# Patient Record
Sex: Female | Born: 1955 | Race: Black or African American | Hispanic: No | Marital: Single | Smoking: Never smoker
Health system: Southern US, Community
[De-identification: ages and names within clinical notes are randomized; demographics above are authoritative.]

## PROBLEM LIST (undated history)

## (undated) DIAGNOSIS — C801 Malignant (primary) neoplasm, unspecified: Secondary | ICD-10-CM

## (undated) DIAGNOSIS — D573 Sickle-cell trait: Secondary | ICD-10-CM

## (undated) DIAGNOSIS — E119 Type 2 diabetes mellitus without complications: Secondary | ICD-10-CM

## (undated) DIAGNOSIS — D689 Coagulation defect, unspecified: Secondary | ICD-10-CM

## (undated) DIAGNOSIS — N649 Disorder of breast, unspecified: Secondary | ICD-10-CM

## (undated) DIAGNOSIS — T7840XA Allergy, unspecified, initial encounter: Secondary | ICD-10-CM

## (undated) DIAGNOSIS — H409 Unspecified glaucoma: Secondary | ICD-10-CM

## (undated) DIAGNOSIS — M1990 Unspecified osteoarthritis, unspecified site: Secondary | ICD-10-CM

## (undated) DIAGNOSIS — I10 Essential (primary) hypertension: Secondary | ICD-10-CM

## (undated) DIAGNOSIS — E785 Hyperlipidemia, unspecified: Secondary | ICD-10-CM

## (undated) HISTORY — DX: Disorder of breast, unspecified: N64.9

## (undated) HISTORY — PX: ROTATOR CUFF REPAIR: SHX139

## (undated) HISTORY — DX: Sickle-cell trait: D57.3

## (undated) HISTORY — PX: FRACTURE SURGERY: SHX138

## (undated) HISTORY — DX: Essential (primary) hypertension: I10

## (undated) HISTORY — PX: TUBAL LIGATION: SHX77

## (undated) HISTORY — PX: POLYPECTOMY: SHX149

## (undated) HISTORY — DX: Hyperlipidemia, unspecified: E78.5

## (undated) HISTORY — DX: Unspecified osteoarthritis, unspecified site: M19.90

## (undated) HISTORY — PX: COSMETIC SURGERY: SHX468

## (undated) HISTORY — DX: Coagulation defect, unspecified: D68.9

## (undated) HISTORY — PX: COLONOSCOPY: SHX174

## (undated) HISTORY — DX: Unspecified glaucoma: H40.9

## (undated) HISTORY — DX: Type 2 diabetes mellitus without complications: E11.9

## (undated) HISTORY — DX: Malignant (primary) neoplasm, unspecified: C80.1

## (undated) HISTORY — DX: Allergy, unspecified, initial encounter: T78.40XA

---

## 2007-02-01 ENCOUNTER — Ambulatory Visit: Payer: Self-pay | Admitting: Nurse Practitioner

## 2007-02-01 DIAGNOSIS — Z853 Personal history of malignant neoplasm of breast: Secondary | ICD-10-CM | POA: Insufficient documentation

## 2007-02-01 DIAGNOSIS — J309 Allergic rhinitis, unspecified: Secondary | ICD-10-CM | POA: Insufficient documentation

## 2007-02-01 DIAGNOSIS — B351 Tinea unguium: Secondary | ICD-10-CM

## 2007-02-01 LAB — CONVERTED CEMR LAB
Basophils Absolute: 0 10*3/uL (ref 0.0–0.1)
CO2: 24 meq/L (ref 19–32)
Cholesterol: 211 mg/dL — ABNORMAL HIGH (ref 0–200)
Creatinine, Ser: 0.81 mg/dL (ref 0.40–1.20)
Eosinophils Relative: 4 % (ref 0–5)
Glucose, Bld: 105 mg/dL — ABNORMAL HIGH (ref 70–99)
HCT: 42.1 % (ref 36.0–46.0)
Hemoglobin: 13.5 g/dL (ref 12.0–15.0)
Lymphocytes Relative: 36 % (ref 12–46)
Lymphs Abs: 2.2 10*3/uL (ref 0.7–4.0)
MCV: 83.4 fL (ref 78.0–100.0)
Monocytes Absolute: 0.5 10*3/uL (ref 0.1–1.0)
RDW: 14.7 % (ref 11.5–15.5)
Total Bilirubin: 0.4 mg/dL (ref 0.3–1.2)
Total CHOL/HDL Ratio: 3.7
Triglycerides: 88 mg/dL (ref <150)
VLDL: 18 mg/dL (ref 0–40)

## 2007-02-02 ENCOUNTER — Encounter (INDEPENDENT_AMBULATORY_CARE_PROVIDER_SITE_OTHER): Payer: Self-pay | Admitting: Nurse Practitioner

## 2007-03-06 ENCOUNTER — Ambulatory Visit: Payer: Self-pay | Admitting: Nurse Practitioner

## 2007-03-06 DIAGNOSIS — A599 Trichomoniasis, unspecified: Secondary | ICD-10-CM | POA: Insufficient documentation

## 2007-03-06 DIAGNOSIS — E7800 Pure hypercholesterolemia, unspecified: Secondary | ICD-10-CM | POA: Insufficient documentation

## 2007-03-06 LAB — CONVERTED CEMR LAB
Bilirubin Urine: NEGATIVE
Blood in Urine, dipstick: NEGATIVE
Glucose, Urine, Semiquant: NEGATIVE
Protein, U semiquant: NEGATIVE
pH: 5.5

## 2007-03-07 ENCOUNTER — Encounter (INDEPENDENT_AMBULATORY_CARE_PROVIDER_SITE_OTHER): Payer: Self-pay | Admitting: Nurse Practitioner

## 2007-03-07 LAB — CONVERTED CEMR LAB: Chlamydia, Swab/Urine, PCR: NEGATIVE

## 2007-03-08 ENCOUNTER — Ambulatory Visit: Payer: Self-pay | Admitting: Nurse Practitioner

## 2007-03-09 ENCOUNTER — Encounter (INDEPENDENT_AMBULATORY_CARE_PROVIDER_SITE_OTHER): Payer: Self-pay | Admitting: Nurse Practitioner

## 2007-03-13 ENCOUNTER — Ambulatory Visit: Payer: Self-pay | Admitting: Internal Medicine

## 2007-03-16 ENCOUNTER — Ambulatory Visit (HOSPITAL_COMMUNITY): Admission: RE | Admit: 2007-03-16 | Discharge: 2007-03-16 | Payer: Self-pay | Admitting: Family Medicine

## 2007-03-20 ENCOUNTER — Ambulatory Visit: Payer: Self-pay | Admitting: Internal Medicine

## 2007-03-20 ENCOUNTER — Encounter: Payer: Self-pay | Admitting: Internal Medicine

## 2007-04-19 ENCOUNTER — Encounter (INDEPENDENT_AMBULATORY_CARE_PROVIDER_SITE_OTHER): Payer: Self-pay | Admitting: Nurse Practitioner

## 2007-08-14 ENCOUNTER — Ambulatory Visit: Payer: Self-pay | Admitting: Nurse Practitioner

## 2007-08-17 ENCOUNTER — Ambulatory Visit: Payer: Self-pay | Admitting: Family Medicine

## 2007-10-29 ENCOUNTER — Encounter (INDEPENDENT_AMBULATORY_CARE_PROVIDER_SITE_OTHER): Payer: Self-pay | Admitting: Nurse Practitioner

## 2008-09-29 ENCOUNTER — Telehealth: Payer: Self-pay | Admitting: Internal Medicine

## 2008-10-20 ENCOUNTER — Telehealth (INDEPENDENT_AMBULATORY_CARE_PROVIDER_SITE_OTHER): Payer: Self-pay | Admitting: Nurse Practitioner

## 2008-10-29 ENCOUNTER — Ambulatory Visit (HOSPITAL_COMMUNITY): Admission: RE | Admit: 2008-10-29 | Discharge: 2008-10-29 | Payer: Self-pay | Admitting: Internal Medicine

## 2008-10-29 DIAGNOSIS — S82899A Other fracture of unspecified lower leg, initial encounter for closed fracture: Secondary | ICD-10-CM | POA: Insufficient documentation

## 2008-11-07 ENCOUNTER — Ambulatory Visit: Payer: Self-pay | Admitting: Internal Medicine

## 2008-11-13 ENCOUNTER — Ambulatory Visit: Payer: Self-pay | Admitting: Internal Medicine

## 2008-11-14 ENCOUNTER — Encounter (INDEPENDENT_AMBULATORY_CARE_PROVIDER_SITE_OTHER): Payer: Self-pay | Admitting: Nurse Practitioner

## 2008-11-17 ENCOUNTER — Observation Stay (HOSPITAL_COMMUNITY): Admission: EM | Admit: 2008-11-17 | Discharge: 2008-11-18 | Payer: Self-pay | Admitting: Emergency Medicine

## 2008-12-09 ENCOUNTER — Observation Stay (HOSPITAL_COMMUNITY): Admission: RE | Admit: 2008-12-09 | Discharge: 2008-12-10 | Payer: Self-pay | Admitting: Professional

## 2008-12-09 ENCOUNTER — Encounter (INDEPENDENT_AMBULATORY_CARE_PROVIDER_SITE_OTHER): Payer: Self-pay | Admitting: Orthopedic Surgery

## 2008-12-09 ENCOUNTER — Ambulatory Visit: Payer: Self-pay | Admitting: Vascular Surgery

## 2008-12-12 ENCOUNTER — Encounter (INDEPENDENT_AMBULATORY_CARE_PROVIDER_SITE_OTHER): Payer: Self-pay | Admitting: Nurse Practitioner

## 2008-12-12 ENCOUNTER — Encounter (INDEPENDENT_AMBULATORY_CARE_PROVIDER_SITE_OTHER): Payer: Self-pay | Admitting: Internal Medicine

## 2008-12-15 ENCOUNTER — Encounter: Payer: Self-pay | Admitting: Nurse Practitioner

## 2008-12-15 ENCOUNTER — Telehealth (INDEPENDENT_AMBULATORY_CARE_PROVIDER_SITE_OTHER): Payer: Self-pay | Admitting: Nurse Practitioner

## 2008-12-15 LAB — CONVERTED CEMR LAB: INR: 1.2

## 2008-12-19 ENCOUNTER — Encounter (INDEPENDENT_AMBULATORY_CARE_PROVIDER_SITE_OTHER): Payer: Self-pay | Admitting: Nurse Practitioner

## 2008-12-23 ENCOUNTER — Ambulatory Visit: Payer: Self-pay | Admitting: Nurse Practitioner

## 2008-12-23 DIAGNOSIS — I82409 Acute embolism and thrombosis of unspecified deep veins of unspecified lower extremity: Secondary | ICD-10-CM | POA: Insufficient documentation

## 2008-12-23 LAB — CONVERTED CEMR LAB: INR: 2.3

## 2008-12-24 ENCOUNTER — Telehealth (INDEPENDENT_AMBULATORY_CARE_PROVIDER_SITE_OTHER): Payer: Self-pay | Admitting: Nurse Practitioner

## 2008-12-25 ENCOUNTER — Encounter (INDEPENDENT_AMBULATORY_CARE_PROVIDER_SITE_OTHER): Payer: Self-pay | Admitting: Nurse Practitioner

## 2008-12-29 ENCOUNTER — Encounter: Admission: RE | Admit: 2008-12-29 | Discharge: 2009-02-27 | Payer: Self-pay | Admitting: Orthopedic Surgery

## 2008-12-31 ENCOUNTER — Encounter (INDEPENDENT_AMBULATORY_CARE_PROVIDER_SITE_OTHER): Payer: Self-pay | Admitting: Nurse Practitioner

## 2009-01-13 ENCOUNTER — Telehealth (INDEPENDENT_AMBULATORY_CARE_PROVIDER_SITE_OTHER): Payer: Self-pay | Admitting: Nurse Practitioner

## 2009-01-13 ENCOUNTER — Encounter: Payer: Self-pay | Admitting: Nurse Practitioner

## 2009-01-21 ENCOUNTER — Ambulatory Visit: Payer: Self-pay | Admitting: Nurse Practitioner

## 2009-01-25 ENCOUNTER — Encounter: Payer: Self-pay | Admitting: Nurse Practitioner

## 2009-02-16 ENCOUNTER — Ambulatory Visit: Payer: Self-pay | Admitting: Nurse Practitioner

## 2009-02-19 LAB — CONVERTED CEMR LAB: Prothrombin Time: 23.9 s — ABNORMAL HIGH (ref 11.6–15.2)

## 2009-03-01 ENCOUNTER — Encounter: Admission: RE | Admit: 2009-03-01 | Discharge: 2009-03-05 | Payer: Self-pay | Admitting: Orthopedic Surgery

## 2009-03-03 ENCOUNTER — Telehealth (INDEPENDENT_AMBULATORY_CARE_PROVIDER_SITE_OTHER): Payer: Self-pay | Admitting: Nurse Practitioner

## 2009-03-19 ENCOUNTER — Ambulatory Visit: Payer: Self-pay | Admitting: Nurse Practitioner

## 2009-03-20 ENCOUNTER — Encounter (INDEPENDENT_AMBULATORY_CARE_PROVIDER_SITE_OTHER): Payer: Self-pay | Admitting: Nurse Practitioner

## 2009-03-20 LAB — CONVERTED CEMR LAB
INR: 2.48 — ABNORMAL HIGH (ref <1.50)
Prothrombin Time: 26.6 s — ABNORMAL HIGH (ref 11.6–15.2)

## 2009-04-02 ENCOUNTER — Telehealth (INDEPENDENT_AMBULATORY_CARE_PROVIDER_SITE_OTHER): Payer: Self-pay | Admitting: Nurse Practitioner

## 2009-04-13 ENCOUNTER — Encounter (INDEPENDENT_AMBULATORY_CARE_PROVIDER_SITE_OTHER): Payer: Self-pay | Admitting: Nurse Practitioner

## 2009-04-20 ENCOUNTER — Ambulatory Visit: Payer: Self-pay | Admitting: Nurse Practitioner

## 2009-04-21 LAB — CONVERTED CEMR LAB
INR: 1.61 — ABNORMAL HIGH (ref <1.50)
Prothrombin Time: 19 s — ABNORMAL HIGH (ref 11.6–15.2)

## 2009-05-07 ENCOUNTER — Telehealth (INDEPENDENT_AMBULATORY_CARE_PROVIDER_SITE_OTHER): Payer: Self-pay | Admitting: Nurse Practitioner

## 2009-05-11 ENCOUNTER — Ambulatory Visit: Payer: Self-pay | Admitting: Nurse Practitioner

## 2009-05-15 ENCOUNTER — Encounter (INDEPENDENT_AMBULATORY_CARE_PROVIDER_SITE_OTHER): Payer: Self-pay | Admitting: Nurse Practitioner

## 2009-06-09 ENCOUNTER — Ambulatory Visit: Payer: Self-pay | Admitting: Nurse Practitioner

## 2009-06-09 ENCOUNTER — Telehealth (INDEPENDENT_AMBULATORY_CARE_PROVIDER_SITE_OTHER): Payer: Self-pay | Admitting: Nurse Practitioner

## 2009-06-10 LAB — CONVERTED CEMR LAB: INR: 1.6 — ABNORMAL HIGH (ref <1.50)

## 2009-06-15 ENCOUNTER — Telehealth (INDEPENDENT_AMBULATORY_CARE_PROVIDER_SITE_OTHER): Payer: Self-pay | Admitting: Nurse Practitioner

## 2009-06-24 ENCOUNTER — Telehealth (INDEPENDENT_AMBULATORY_CARE_PROVIDER_SITE_OTHER): Payer: Self-pay | Admitting: Nurse Practitioner

## 2009-06-24 ENCOUNTER — Ambulatory Visit: Payer: Self-pay | Admitting: Vascular Surgery

## 2009-06-24 ENCOUNTER — Encounter (INDEPENDENT_AMBULATORY_CARE_PROVIDER_SITE_OTHER): Payer: Self-pay | Admitting: Internal Medicine

## 2009-06-24 ENCOUNTER — Ambulatory Visit: Admission: RE | Admit: 2009-06-24 | Discharge: 2009-06-24 | Payer: Self-pay | Admitting: Internal Medicine

## 2009-08-04 ENCOUNTER — Ambulatory Visit: Payer: Self-pay | Admitting: Nurse Practitioner

## 2009-08-04 DIAGNOSIS — E669 Obesity, unspecified: Secondary | ICD-10-CM | POA: Insufficient documentation

## 2009-08-04 LAB — CONVERTED CEMR LAB
ALT: 9 units/L (ref 0–35)
AST: 16 units/L (ref 0–37)
BUN: 13 mg/dL (ref 6–23)
Basophils Absolute: 0 10*3/uL (ref 0.0–0.1)
Basophils Relative: 1 % (ref 0–1)
Blood in Urine, dipstick: NEGATIVE
Creatinine, Ser: 0.77 mg/dL (ref 0.40–1.20)
Eosinophils Absolute: 0.2 10*3/uL (ref 0.0–0.7)
HDL: 56 mg/dL (ref >39)
KOH Prep: NEGATIVE
Ketones, urine, test strip: NEGATIVE
MCHC: 32 g/dL (ref 30.0–36.0)
MCV: 84.7 fL (ref 78.0–100.0)
Neutrophils Relative %: 51 % (ref 43–77)
Nitrite: NEGATIVE
OCCULT 1: NEGATIVE
Platelets: 322 10*3/uL (ref 150–400)
RDW: 15.4 % (ref 11.5–15.5)
Rapid HIV Screen: NEGATIVE
TSH: 1.817 microintl units/mL (ref 0.350–4.500)
Total Bilirubin: 0.3 mg/dL (ref 0.3–1.2)
Total CHOL/HDL Ratio: 4
VLDL: 16 mg/dL (ref 0–40)
WBC: 5.7 10*3/uL (ref 4.0–10.5)

## 2009-08-05 ENCOUNTER — Encounter (INDEPENDENT_AMBULATORY_CARE_PROVIDER_SITE_OTHER): Payer: Self-pay | Admitting: Nurse Practitioner

## 2009-08-05 ENCOUNTER — Telehealth (INDEPENDENT_AMBULATORY_CARE_PROVIDER_SITE_OTHER): Payer: Self-pay | Admitting: Nurse Practitioner

## 2009-08-07 ENCOUNTER — Ambulatory Visit: Payer: Self-pay | Admitting: Nurse Practitioner

## 2009-08-07 LAB — CONVERTED CEMR LAB: Pap Smear: NEGATIVE

## 2009-08-10 ENCOUNTER — Ambulatory Visit: Payer: Self-pay | Admitting: Nurse Practitioner

## 2009-08-21 ENCOUNTER — Telehealth (INDEPENDENT_AMBULATORY_CARE_PROVIDER_SITE_OTHER): Payer: Self-pay | Admitting: Nurse Practitioner

## 2009-09-23 ENCOUNTER — Ambulatory Visit: Payer: Self-pay | Admitting: Nurse Practitioner

## 2009-09-23 DIAGNOSIS — M79609 Pain in unspecified limb: Secondary | ICD-10-CM

## 2009-11-20 ENCOUNTER — Encounter (INDEPENDENT_AMBULATORY_CARE_PROVIDER_SITE_OTHER): Payer: Self-pay | Admitting: Nurse Practitioner

## 2009-11-20 ENCOUNTER — Telehealth (INDEPENDENT_AMBULATORY_CARE_PROVIDER_SITE_OTHER): Payer: Self-pay | Admitting: Nurse Practitioner

## 2009-11-20 DIAGNOSIS — Z780 Asymptomatic menopausal state: Secondary | ICD-10-CM | POA: Insufficient documentation

## 2009-12-07 ENCOUNTER — Encounter (INDEPENDENT_AMBULATORY_CARE_PROVIDER_SITE_OTHER): Payer: Self-pay | Admitting: Nurse Practitioner

## 2009-12-07 ENCOUNTER — Ambulatory Visit (HOSPITAL_COMMUNITY): Admission: RE | Admit: 2009-12-07 | Discharge: 2009-12-07 | Payer: Self-pay | Admitting: Internal Medicine

## 2009-12-15 ENCOUNTER — Encounter (INDEPENDENT_AMBULATORY_CARE_PROVIDER_SITE_OTHER): Payer: Self-pay | Admitting: Nurse Practitioner

## 2010-03-20 ENCOUNTER — Encounter: Payer: Self-pay | Admitting: Internal Medicine

## 2010-03-25 ENCOUNTER — Ambulatory Visit
Admission: RE | Admit: 2010-03-25 | Discharge: 2010-03-25 | Payer: Self-pay | Source: Home / Self Care | Attending: Nurse Practitioner | Admitting: Nurse Practitioner

## 2010-03-25 DIAGNOSIS — R030 Elevated blood-pressure reading, without diagnosis of hypertension: Secondary | ICD-10-CM | POA: Insufficient documentation

## 2010-03-25 DIAGNOSIS — R110 Nausea: Secondary | ICD-10-CM | POA: Insufficient documentation

## 2010-03-25 LAB — CONVERTED CEMR LAB
Glucose, Bld: 99 mg/dL
LDL Goal: 160 mg/dL

## 2010-03-30 NOTE — Letter (Signed)
Summary: REFERRAL//PODIATRY//APPT DATE & TIME  REFERRAL//PODIATRY//APPT DATE & TIME   Imported By: Arta Bruce 09/24/2009 14:43:43  _____________________________________________________________________  External Attachment:    Type:   Image     Comment:   External Document

## 2010-03-30 NOTE — Letter (Signed)
Summary: *HSN Results Follow up  [facility]  [address] [place], [place] [address]   Phone: [phone]  Fax: [phone]      12/15/2009   [facility] [address] [place], [place]  [address]   Dear  Ms. Joscelyne Mackiewicz,                            ____S.Drinkard,FNP   ____D. Gore,FNP       ____B. McPherson,MD   ____V. Rankins,MD    ____E. Mulberry,MD    _X___N. [name], FNP  ____D. Reche Dixon, MD    ____K. Philipp Deputy, MD    ____Other     This letter is to inform you that your recent test(s):  Bone Density     ___X____ is within acceptable limits  _______ requires a medication change  _______ requires a follow-up lab visit  _______ requires a follow-up visit with your Braxton Vantrease   Comments: Bone density results are normal.       _________________________________________________________ If you have any questions, please contact our office [phone].                    Sincerely,    [name] [facility]

## 2010-03-30 NOTE — Letter (Signed)
Summary: periodontal scalings  periodontal scalings   Imported By: Arta Bruce 04/13/2009 15:53:32  _____________________________________________________________________  External Attachment:    Type:   Image     Comment:   External Document

## 2010-03-30 NOTE — Letter (Signed)
Summary: Bethany MENTOR/FORM  Cushing MENTOR/FORM   Imported By: Arta Bruce 08/11/2009 09:26:18  _____________________________________________________________________  External Attachment:    Type:   Image     Comment:   External Document

## 2010-03-30 NOTE — Letter (Signed)
Summary: *HSN Results Follow up  [facility]  [address] [place], [place] [address]   Phone: [phone]  Fax: [phone]      03/20/2009   [facility] [address] [place], [place]  [address]   Dear  [name],                            ____S.Drinkard,FNP   ____D. Gore,FNP       ____B. McPherson,MD   ____V. Rankins,MD    ____E. Mulberry,MD    _X___N. [name], FNP  ____D. Reche Dixon, MD    ____K. Philipp Deputy, MD    ____Other     This letter is to inform you that your recent test(s):  _______Pap Smear    __X_____Lab Test     _______X-ray    ___X____ is within acceptable limits  _______ requires a medication change  _______ requires a follow-up lab visit  _______ requires a follow-up visit with your provider   Comments:  Your INR is 2.4.  Continue coumadin at current dose. Follow up in 4 weeks for PT/INR.  [facility] Your therapy should continue until April 26th, 2011.      _________________________________________________________ If you have any questions, please contact our office [phone].                    Sincerely,    [name] [facility]

## 2010-03-30 NOTE — Letter (Signed)
Summary: TEST ORDER FORM//MAMMOGRAM//APPT DATE & TIME  TEST ORDER FORM//MAMMOGRAM//APPT DATE & TIME   Imported By: Arta Bruce 12/03/2009 14:27:50  _____________________________________________________________________  External Attachment:    Type:   Image     Comment:   External Document

## 2010-03-30 NOTE — Letter (Signed)
Summary: Discharge Summary  Discharge Summary   Imported By: Arta Bruce 04/14/2009 11:32:55  _____________________________________________________________________  External Attachment:    Type:   Image     Comment:   External Document

## 2010-03-30 NOTE — Assessment & Plan Note (Signed)
Summary: Left foot pain   Vital Signs:  Patient profile:   55 year old female Menstrual status:  postmenopausal Weight:      220.2 pounds BMI:     36.78 Temp:     97.5 degrees F oral Pulse rate:   75 / minute Pulse rhythm:   regular Resp:     20 per minute BP sitting:   131 / 89  (left arm) Cuff size:   regular  Vitals Entered By: [name] (September 23, 2009 11:02 AM) CC: pt has a heel spur in left heel she is now having a lot of pain she is not sure if it come from her working out or gaining weight it started when she had a massage in May Is Patient Diabetic? No Pain Assessment Patient in pain? yes     Location: heel Onset of pain  With activity  Does patient need assistance? Functional Status Self care Ambulation Normal   CC:  pt has a heel spur in left heel she is now having a lot of pain she is not sure if it come from her working out or gaining weight it started when she had a massage in May.  History of Present Illness:  Pt into the office with pain in the left foot ?heel spur stabbing pain in the left foot intermittent when driving, standing. Pain last for 1 minute then subsides Causes may be +overweight +pt has increased her exercise with weight and zumba Pt remembered when she was having a reflex massage that when he touched the area of concern she noticed at that time it was tender  Right toenail - came off spontaneously about 1 month ago. She had taken meds in the past which did not help. Pt has questions about what she can do for the toenail.  Diabetic Foot Exam Foot Inspection Are the toenails thick?                Yes Are the toenails ingrown?              No  Diabetic Foot Care Education   Allergies (verified): No Known Drug Allergies  Review of Systems CV:  Denies chest pain or discomfort. Resp:  Denies cough. GI:  Denies abdominal pain, nausea, and vomiting. MS:  left foot pain.  Physical Exam  General:  alert.   Head:   normocephalic.   Extremities:  1+ right pedal edema.   Neurologic:  alert & oriented X3.     Foot/Ankle Exam  Foot Exam:    Left:    Inspection:  Normal    Palpation:  Abnormal       Location:  hindfoot    Stability:  stable    Tenderness:  no    Swelling:  no    Erythema:  no   Impression & Recommendations:  Problem # 1:  FOOT PAIN, LEFT (ICD-729.5) handout given for heel spur advised pt to wear insert in her shoes  Problem # 2:  OBESITY (ICD-278.00) pt has been trying to lose weight so advised her to wear good supportive shoes  Problem # 3:  DERMATOPHYTOSIS, NAIL (ICD-110.1) will refer to podiatry for possible removal of great toe nail she has tried and failed topical and oral agents Orders: Podiatry Referral (Podiatry)  Complete Medication List: 1)  Caltrate 600+d 600-400 Mg-unit Tabs (Calcium carbonate-vitamin d) .... One tablet by mouth two times a day  Patient Instructions: 1)  Read the handout on heel spurs. 2)  Remember to wear good inserts in your shoes 3)  You will be referred to the podiatrist.

## 2010-03-30 NOTE — Progress Notes (Signed)
Summary: Office Visit/depression screening  Office Visit/depression screening   Imported By: Arta Bruce 09/07/2009 16:04:20  _____________________________________________________________________  External Attachment:    Type:   Image     Comment:   External Document

## 2010-03-30 NOTE — Letter (Signed)
Summary: Lipid Letter  [facility]  [address] [place], [place] [address]   Phone: [phone]  Fax: [phone]    08/05/2009  Suzanne Baldwin [address] [place], [place]  [address]  Dear [name]:  We have carefully reviewed your last lipid profile from 08/04/2009 and the results are noted below with a summary of recommendations for lipid management.    Cholesterol:       223     Goal: less than 200   HDL "good" Cholesterol:   56     Goal: greater than 40   LDL "bad" Cholesterol:   151     Goal: less than 130   Triglycerides:       82     Goal: less than 150    Your cholesterol is still high.  I would still like for you to manage with diet and exercise.  Be sure to avoid fried fatty foods.  Make low fat food choices with milk, cheese, and mayo,  Pap Smear results ___________________________.     Current Medications: 1)    Caltrate 600+d 600-400 Mg-unit Tabs (Calcium carbonate-vitamin d) .... One tablet by mouth two times a day  If you have any questions, please call. We appreciate being able to work with you.   Sincerely,    [facility] [name]

## 2010-03-30 NOTE — Progress Notes (Signed)
Summary: Vascular results   Phone Note From Other Clinic   Summary of Call: Called report from Vascular lab.  Negative for DVT.  Pt anxious to stop coumadin she can be reached at [phone]. Initial call taken by: Vesta Mixer CMA,  June 24, 2009 3:05 PM  Follow-up for Phone Call        when did pt have it done, today? no report yet in EMR. If you personally spoke with radiology and they said test was negative then ok to have pt stop the coumadin. otherwise will need to see the results Follow-up by: [name],  June 24, 2009 3:42 PM  Additional Follow-up for Phone Call Additional follow up Details #1::        Yes, it was done today and did speak with Vascular lab personally.  Will call pt to inform her. Additional Follow-up by: Vesta Mixer CMA,  June 24, 2009 3:46 PM    Additional Follow-up for Phone Call Additional follow up Details #2::    Pt. is aware that she may stop coumadin  ....Marland KitchenMarland KitchenMarland Kitchen Chauncy Passy SMA     June 24, 2009 3:53 PM

## 2010-03-30 NOTE — Letter (Signed)
Summary: Handout Printed  Printed Handout:  - Heel Spur 

## 2010-03-30 NOTE — Assessment & Plan Note (Signed)
Summary: PTINR////KT   Nurse Visit  pt is takes two 5mg  tabs at 6pm everyday....   Allergies: No Known Drug Allergies  Orders Added: 1)  Est. Patient Level I [44010] 2)  T-Protime, Auto [[id number]]

## 2010-03-30 NOTE — Assessment & Plan Note (Signed)
Summary: Complete Physical Exam   Vital Signs:  Patient profile:   55 year old female Menstrual status:  postmenopausal Weight:      217.3 pounds BMI:     36.29 BSA:     2.05 Temp:     98.0 degrees F oral Pulse rate:   77 / minute Pulse rhythm:   regular Resp:     20 per minute BP sitting:   149 / 91  (left arm) Cuff size:   regular  Vitals Entered By: [name] (August 04, 2009 9:14 AM) CC: CPP...still having swelling in her ankle Is Patient Diabetic? No Pain Assessment Patient in pain? no       Does patient need assistance? Functional Status Self care Ambulation Normal  Vision Screening:      Vision Comments: 02/2010   CC:  CPP...still having swelling in her ankle.  History of Present Illness:  Pt into the office for CPE.  PAP - last done in 2009 Maternal aunt with cervical cancer postmenopausal - last menses 5 years ago.  Mammogram - last done 10/29/2008 pt is a breast cancer survivor at age 32.  Mother then was dx after pt - she had double mastectomy.  Optho - Last eye exam in 2011.  New glasses for reading  Dental - last exam was March 2011 for preventative care.  S/p right ankle fracture and DVT - over 6 months ago.  She is no longer on coumadin. No pain. Notes that she does still have some swelling with she is up for extended periods of time.  Colonscopy - up to date done in 2009.  pt will need repeated in 7 years  Obesity - lost 9 pounds since her last exam. pt has been trying to lose weight.    Diabetes Management History:      She says that she is exercising.  Type of exercise includes: walking, aerobics.  She is doing this 3 times per week.    Habits & Providers  Alcohol-Tobacco-Diet     Alcohol drinks/day: 0     Tobacco Status: never  Exercise-Depression-Behavior     Does Patient Exercise: yes     Exercise Counseling: not indicated; exercise is adequate     Type of exercise: walking, aerobics,zumba     Times/week: 3     Have  you felt down or hopeless? no     Have you felt little pleasure in things? no     Depression Counseling: not indicated; screening negative for depression     Drug Use: no     Seat Belt Use: 100  Comments: PHQ-9 score = 4  Allergies (verified): No Known Drug Allergies  Social History: Smoking Status:  never  Review of Systems General:  Denies fever. Eyes:  Denies blurring. ENT:  Denies earache. CV:  Denies chest pain or discomfort. Resp:  Denies cough. GI:  Denies abdominal pain and constipation. GU:  Denies dysuria. MS:  right ankle sweeling. Derm:  Denies rash. Neuro:  Denies headaches. Psych:  Denies anxiety and depression.  Physical Exam  General:  alert.   Head:  normocephalic.   Eyes:  pupils equal, pupils round, and pupils reactive to light.   Ears:  ear piercing(s) noted.   TM visible with bony landmarks present Nose:  no nasal discharge.   Mouth:  pharynx pink and moist.   Neck:  supple.   Chest Wall:  no mass.   Breasts:  skin/areolae normal, no masses, and no abnormal  thickening.  right breast - evidence of previous surgery Lungs:  normal breath sounds.   Heart:  normal rate and regular rhythm.   Abdomen:  normal bowel sounds.   Rectal:  external hemorrhoid(s).   Msk:  normal ROM.   Pulses:  R radial normal and L radial normal.   Extremities:  right ankle - slight edema Neurologic:  alert & oriented X3 and gait normal.   Skin:  color normal.   Psych:  Oriented X3.    Pelvic Exam  Vulva:      normal appearance.   Urethra and Bladder:      Urethra--normal.   Vagina:      physiologic discharge.   Cervix:      midposition.   Uterus:      smooth.   Adnexa:      nontender bilaterally.   Rectum:      normal, heme negative stool.    Diabetes Management Exam:    Eye Exam:       Eye Exam done elsewhere          Date: 03/02/2009          Results: needs reading glasses          Done by: Optho    Impression & Recommendations:  Problem # 1:   ROUTINE GYNECOLOGICAL EXAMINATION (ICD-V72.31) PAP done Labs done optho and dental exams up to date PHQ-9 score = 4 EKG done guiaic negative; colonscopy up to date Orders: UA Dipstick w/o Micro (manual) ([address]) KOH/ WET Mount [phone]) Pap Smear, Thin Prep ( Collection of) (Q0091) T- GC Chlamydia (09811) EKG w/ Interpretation (93000) Hemoccult Guaiac-1 spec.(in office) (82270)  Problem # 2:  OBESITY (ICD-278.00) 9 pounds since her recent office visit pt is trying to lose weight.  spoke to pt about diet Orders: T-TSH [phone])  Problem # 3:  HYPERCHOLESTEROLEMIA (ICD-272.0) will check labs today Orders: T-Lipid Profile ([id number]) T-Comprehensive Metabolic Panel ([id number]) T-CBC w/Diff (96295-28413) EKG w/ Interpretation (93000)  Complete Medication List: 1)  Caltrate 600+d 600-400 Mg-unit Tabs (Calcium carbonate-vitamin d) .... One tablet by mouth two times a day  Other Orders: Rapid HIV  (24401)  Patient Instructions: 1)  Your mammogram and bone density will not be due tuntil September. 2)  it was done October 29, 2008. 3)  You will be informed of your labs. 4)  Be mindful.  Your blood pressure is slightly elevated today.  Keep a watchful eye on this.  Be sure to monitor salt intake. 5)  Follow up as needed  Laboratory Results   Urine Tests  Date/Time Received: August 04, 2009 9:29 AM   Routine Urinalysis   Color: lt. yellow Appearance: Clear Glucose: negative   (Normal Range: Negative) Bilirubin: negative   (Normal Range: Negative) Ketone: negative   (Normal Range: Negative) Spec. Gravity: 1.010   (Normal Range: 1.003-1.035) Blood: negative   (Normal Range: Negative) pH: 7.0   (Normal Range: 5.0-8.0) Protein: negative   (Normal Range: Negative) Urobilinogen: 0.2   (Normal Range: 0-1) Nitrite: negative   (Normal Range: Negative) Leukocyte Esterace: negative   (Normal Range: Negative)    Date/Time Received: August 04, 2009 11:00 AM  Date/Time  Reported: August 04, 2009 11:00 AM   Wet Mount/KOH Source: vaginal WBC/hpf: 1-5 Bacteria/hpf: rare Clue cells/hpf: none Yeast/hpf: none Trichomonas/hpf: none  Other Tests  Rapid HIV: negative  Stool - Occult Blood Hemmoccult #1: negative Date: 08/04/2009     Osteoporosis  Patient complains of: kyphosis  No back pain No prior fracture No height loss No  Patient reports: Personal history of fracture Yes Hx of Fx in a 1st degree relative No Female Gender Yes Dementia No Poor health/fragility No Current cigarette smoker No Low body weight (<127 lbs) No Estrogen deficiency Yes Low calcium intake (lifelong) No Alcoholism No Inadequate physical activity No Poor eyesight/risk of falls No     EKG  Procedure date:  08/04/2009  Findings:      normal:  69  Laboratory Results   Urine Tests    Routine Urinalysis   Color: lt. yellow Appearance: Clear Glucose: negative   (Normal Range: Negative) Bilirubin: negative   (Normal Range: Negative) Ketone: negative   (Normal Range: Negative) Spec. Gravity: 1.010   (Normal Range: 1.003-1.035) Blood: negative   (Normal Range: Negative) pH: 7.0   (Normal Range: 5.0-8.0) Protein: negative   (Normal Range: Negative) Urobilinogen: 0.2   (Normal Range: 0-1) Nitrite: negative   (Normal Range: Negative) Leukocyte Esterace: negative   (Normal Range: Negative)      Wet Mount Wet Mount KOH: Negative  Other Tests  Rapid HIV: negative  Stool - Occult Blood Hemmoccult #1: negative

## 2010-03-30 NOTE — Progress Notes (Signed)
Summary: DROPPED OFF PAPER TO BE FILLED OUT   Phone Note Call from Patient Call back at Home Phone [phone]   Summary of Call: MARTIN PT. MS Muhlbauer DROPPED OFF HER PAPER TO BE FILLED OUT AFTER SHE HAD HER CPP YESTERDAY. MS Captain SAYS THAT SHE'S BRINGING HER SON BACK ON FRIDAY TO HAVE HIS PPD READ, AND WANTS TO KNOW IF SHE CAN GET HER PAPER ON FRIDAY. Initial call taken by: [name],  August 05, 2009 2:41 PM  Follow-up for Phone Call        I have the form but pt needs a TB skin test for it to be completed. Has she had one within the past year? The last one on file for this office was in 2009. She will need one placed if no current record Form otherwise complete Follow-up by: [name],  August 06, 2009 9:25 AM  Additional Follow-up for Phone Call Additional follow up Details #1::        Spoke with pt. and advised of provider's response.  Pt. will be in tomorrow to get PPD.  Dutch Quint RN  August 06, 2009 11:14 AM

## 2010-03-30 NOTE — Medication Information (Signed)
Summary: Suzanne Baldwin   Imported By: Arta Bruce 07/15/2009 14:39:54  _____________________________________________________________________  External Attachment:    Type:   Image     Comment:   External Document

## 2010-03-30 NOTE — Progress Notes (Signed)
Summary: ? about DVT   Phone Note Call from Patient   Summary of Call: pt says she is suppose to come off the med (coumdian) med and she wants to know how for sure you know the clot is gone and will not return... Initial call taken by: [place] Shannon,  June 09, 2009 9:07 AM  Follow-up for Phone Call        Pt should be scheduled for right lower extremity doppler to check for resolution of the DVT (order in basket) schedule at the beginning of May as pt will be finished with coumadin by then studies show that her rate for reoccurence is low as she likely obtained blood clot due to her immobile status during fracture but no 100% guarantee can be given  Follow-up by: [name],  June 16, 2009 3:09 PM  Additional Follow-up for Phone Call Additional follow up Details #1::        [name]  June 18, 2009 10:33 AM Left message on machine for pt to return call to the office.  [name]  June 18, 2009 4:20 PM pt informed of above information and is scheduled for doppler on 06/24/09  Additional Follow-up by: [name],  June 18, 2009 4:21 PM

## 2010-03-30 NOTE — Progress Notes (Signed)
Summary: HAS NO MORE COUMADIN   Phone Note Call from Patient Call back at Home Phone [phone]   Reason for Call: Refill Medication Summary of Call: MARTIN PT. MS Gao SAYS THAT SHE HAS NO MORE REFILLS ON HER COUMADIN AND SHE TOOK HER LAST 2 LAST NIGHT AND SHE NEEDS FOR [place] TO [facility] [address]. Initial call taken by: [name],  April 02, 2009 9:28 AM  Follow-up for Phone Call        forward to N. [name], FNP Follow-up by: [name],  April 02, 2009 10:21 AM  Additional Follow-up for Phone Call Additional follow up Details #1::        Rx sent electronically  notify pt to check with the pharmacy Additional Follow-up by: [name],  April 02, 2009 10:56 AM    Additional Follow-up for Phone Call Additional follow up Details #2::    pt informed. Follow-up by: [name],  April 02, 2009 4:43 PM  Prescriptions: WARFARIN SODIUM 5 MG TABS (WARFARIN SODIUM) Two tablets by mouth nightly to thin blood  #60 x 0   Entered and Authorized by:   [name]   Signed by:   [name] on 04/02/2009   Method used:   [facility]  [address] [phone][facility] (retail[facility]       [address]       [place], [place], [place]  [id number]       Ph: [phone] or [phone]       Fax: [phone]   RxID:   [id number]

## 2010-03-30 NOTE — Progress Notes (Signed)
Summary: Coumadin Refill   Phone Note Call from Patient Call back at [facility] Phone [phone]   Summary of Call: The pt needs more refills from her coumadin, ([facility] [address]) [name] FNP Initial call taken by: Manon Hilding,  May 07, 2009 8:24 AM  Follow-up for Phone Call        forward to N. [name], FNP last filled 04/02/09 #60 Follow-up by: [name],  May 07, 2009 4:02 PM  Additional Follow-up for Phone Call Additional follow up Details #1::        [facility] Notify pt she can check with the pharmacy  Additional Follow-up by: [name],  May 07, 2009 4:13 PM    Additional Follow-up for Phone Call Additional follow up Details #2::    [name]  May 07, 2009 4:22 PM Left message on machine for pt to return call to the office.  [name]  May 07, 2009 4:49 PM Pt informed.  Prescriptions: WARFARIN SODIUM 5 MG TABS (WARFARIN SODIUM) Two tablets by mouth nightly to thin blood  #60 x 0   Entered and Authorized by:   [name]   Signed by:   [name] on 05/07/2009   Method used:   [facility]  [address] [phone]* (retail)       [address]       [place], [place]  [id number]       Ph: [phone] or [phone]       Fax: [phone]   RxID:   [id number]

## 2010-03-30 NOTE — Progress Notes (Signed)
Summary: maqssage   Phone Note Call from Patient Call back at Home Phone [phone]   Summary of Call: Ms. Ruder is requesting the provider call her back because she has a medical question. [facility] FNP  Initial call taken by: Manon Hilding,  March 03, 2009 10:22 AM  Follow-up for Phone Call        pt called wanted to know if she could have a full body massage for one hour. Discussed with Jesse Fall FNP ok to have massage needs to go 30 minutes then get up walk around and then have the last 30 minutes. Follow-up by: Gaylyn Cheers RN,  March 03, 2009 12:43 PM

## 2010-03-30 NOTE — Progress Notes (Signed)
Summary: NEEDS BONE DENSITY REF.  Phone Note Call from Patient Call back at [facility] Phone [phone]   Summary of Call: Suzanne Baldwin WAS TOLD TO CALL TO LET YOU KNOW THAT SHE GOT HER LETTER IN THE MAIL TO SCHEDULE HER MAMOGRAM AND THAT WE NEED TO SCHEDULE THE BONE DENSITY. MS [name] IS GOING TO CALL AND SET HER MAMOGRAM APPT. ON MONDAY [facility] AND LET us KNOW BECAUSE SHE WANTS THE BONE DENSITY THE SAME DAY. Initial call taken by: [name],  November 20, 2009 4:17 PM  Follow-up for Phone Call        forward to N. [name], fnp Follow-up by: [name],  November 23, 2009 9:21 AM  Additional Follow-up for Phone Call Additional follow up Details #1::        Order in basket - it appears that pt has already scheduled her mammogram so ok to schedule bone density on the same day if possible if not then get what they have available notify pt orders in basket Additional Follow-up by: [name],  November 23, 2009 3:06 PM  New Problems: POSTMENOPAUSAL STATUS (ICD-V49.81) UNSPECIFIED BREAST SCREENING (ICD-V76.10)   Additional Follow-up for Phone Call Additional follow up Details #2::    [name]  November 30, 2009 9:12 AM  Left message on machine for pt to return call to the office.  MS [name] CALLED AND SAYS THAT HER MAMOGRAM IS ON MONDAY 10/10 AT 2:20 AND NEEDS [place] TO CALL AND GET THE DEXA SCAN ON THE SAME DAY.[name]  December 02, 2009 9:44 AM  ok to schedule as per my previous note n.[email or web address],fnp December 02, 2009  1:50 PM pt informed and dexa scan scheduled on same day as mammogram. Referral faxed to womens   Follow-up by: [name],  December 03, 2009 5:29 PM  New Problems: POSTMENOPAUSAL STATUS (ICD-V49.81) UNSPECIFIED BREAST SCREENING (ICD-V76.10)

## 2010-03-30 NOTE — Letter (Signed)
Summary: Handout Printed  Printed Handout:  - Diet - Low-Cholesterol Guidelines 

## 2010-03-30 NOTE — Progress Notes (Signed)
Summary: coumadin refill   Phone Note Call from Patient   Summary of Call: Pt requesting refill of her coumadin to [facility] [address].  She says she is supposed to be on it until April 26th. Initial call taken by: Vesta Mixer CMA,  June 15, 2009 2:55 PM  Follow-up for Phone Call        med refilled Follow-up by: [name],  June 15, 2009 4:42 PM  Additional Follow-up for Phone Call Additional follow up Details #1::        pt informed.  Additional Follow-up by: [name],  June 15, 2009 4:55 PM    Prescriptions: WARFARIN SODIUM 5 MG TABS (WARFARIN SODIUM) Two tablets by mouth nightly to thin blood  #60 x 0   Entered and Authorized by:   [name]   Signed by:   [name] on 06/15/2009   Method used:   [facility]ically to        [facility]  [address] [phone]* (retail)       [address]       [place], [place]  [id number]       Ph: [phone] or [phone]       Fax: [phone]   RxID:   [id number]

## 2010-03-30 NOTE — Letter (Signed)
Summary: TEST ORDER FORM//LE VENOUS DUPLEX//APPT DADTE & TIME  TEST ORDER FORM//LE VENOUS DUPLEX//APPT DADTE & TIME   Imported By: Arta Bruce 08/18/2009 14:27:04  _____________________________________________________________________  External Attachment:    Type:   Image     Comment:   External Document

## 2010-03-30 NOTE — Progress Notes (Signed)
Summary: toenail came off   Phone Note Call from Patient   Summary of Call: pt is calling today because she says 2 years ago she had a toenail infection in her right big toe and was treated by provider with some pills.  She says that over several months she files and soaks nails and feet and today her toenail came completely off. She says she is not having any pain and wants to know will the nail grow back and should she cover it up.  I spoke with Aggie Cosier and she said that she needs to cover toenail loosely and that the nail should grow back, unless she damaged the nail root. Pt informed of that information and will call if toe or toenail looks red,swollen or infected Initial call taken by: [name],  August 21, 2009 2:44 PM  Follow-up for Phone Call        It appears that pt does have a history of toenail fungus so likely this is the cause of her toenail spontaneous removal. instructions ok as given pt is not diabetes and is not at high risk for infection so ok for her to monitor at home and report changes to this office Follow-up by: [name],  August 24, 2009 8:08 AM

## 2010-04-01 NOTE — Assessment & Plan Note (Signed)
Summary: Acute - Nausea   Vital Signs:  Patient profile:   55 year old female Menstrual status:  postmenopausal Weight:      224.7 pounds BMI:     37.53 BSA:     2.08 Temp:     98.5 degrees F oral Pulse rate:   80 / minute Pulse rhythm:   regular Resp:     20 per minute BP sitting:   130 / 84  (left arm) Cuff size:   regular  Vitals Entered By: [name] (March 25, 2010 9:45 AM)  Nutrition Counseling: Patient's BMI is greater than 25 and therefore counseled on weight management options. CC: x 2 months not everyday she has been experiencing nausea at  certain times of the day with or without eating food, Lipid Management Is Patient Diabetic? No Pain Assessment Patient in pain? no       Does patient need assistance? Functional Status Self care Ambulation Normal   CC:  x 2 months not everyday she has been experiencing nausea at  certain times of the day with or without eating food and Lipid Management.  History of Present Illness:  Pt into the office with c/o of nausea. Started about 2 months ago. Sometimes the feeling of nausea is there when she wakes in the morning or it can occur as the day goes on. +nausea -vomiting The sensation goes away on it's own.  Eating does not make the sensation get any better or worse.  Feeling can be present AFTER eating as well. Drinks lots of water, denies any soda, coffee and tea Occasional spagetti based products -ETOH -Tobacco   Prehypertension +weight gain -ETOH -Tobacco  +parents with htn -headache, chest pain or fatigue     Lipid Management History:      Negative NCEP/ATP III risk factors include female age less than 24 years old, non-diabetic, no family history for ischemic heart disease, non-tobacco-user status, non-hypertensive, and no prior stroke/TIA.     Habits & Providers  Alcohol-Tobacco-Diet     Alcohol drinks/day: 0     Tobacco Status: never  Exercise-Depression-Behavior     Does Patient  Exercise: yes     Exercise Counseling: not indicated; exercise is adequate     Type of exercise: walking, aerobics,zumba     Times/week: 3     Have you felt down or hopeless? no     Have you felt little pleasure in things? no     Depression Counseling: not indicated; screening negative for depression     Drug Use: no     Seat Belt Use: 100  Allergies (verified): No Known Drug Allergies  Review of Systems General:  Denies fever. CV:  Denies chest pain or discomfort. Resp:  Denies cough. GI:  Complains of nausea; denies abdominal pain and vomiting. Psych:  +stress.  Physical Exam  General:  alert.   Head:  normocephalic.   Ears:  ear piercing(s) noted.   Neurologic:  alert & oriented X3.   Psych:  Oriented X3.     Impression & Recommendations:  Problem # 1:  PREHYPERTENSION (ICD-796.2) handout given DASH diet  advised pt to make some modifiable lifestyle changes  Problem # 2:  NAUSEA (ICD-787.02)  advised pt to monitor diet increase stressors may be causing acid in the stomach  Orders: Capillary Blood Glucose/CBG (16109)  Problem # 3:  OBESITY (ICD-278.00) pt to increase her exercise and decrease the weight  Problem # 4:  NEED PROPHYLACTIC VACCINATION&INOCULATION FLU (ICD-V04.81) given today  Complete Medication List: 1)  Caltrate 600+d 600-400 Mg-unit Tabs (Calcium carbonate-vitamin d) .... One tablet by mouth two times a day 2)  Nexium 40 Mg Cpdr (Esomeprazole magnesium) .... One tablet by mouth before breakfast for stomach  Other Orders: Flu Vaccine 44yrs + (16109) Admin 1st Vaccine (60454)  Lipid Assessment/Plan:      Based on NCEP/ATP III, the patient's risk factor category is "0-1 risk factors".  The patient's lipid goals are as follows: Total cholesterol goal is 200; LDL cholesterol goal is 160; HDL cholesterol goal is 40; Triglyceride goal is 150.    Patient Instructions: 1)  Prehypertension - blood pressure is slightly elevated.  Will monitor.  Be  sure to check your blood pressure at your local pharmacy if you do not have a blood pressure cuff. 2)  Flu vaccine given today in office 3)  Nausea - may be due in part to stress. 4)  You should start nexium 40mg  by mouth daily before breakfast.  This will cut down on the nausea feeling but may not completely rid it until stress decrease. 5)  Cholesterol - review your last labs.  Will recheck again in June.   6)  Follow up as needed Prescriptions: NEXIUM 40 MG CPDR (ESOMEPRAZOLE MAGNESIUM) One tablet by mouth before breakfast for stomach  #30 x 11   Entered and Authorized by:   [name]   Signed by:   [name] on 03/25/2010   Method used:   Print then Give to Patient   RxID:   [id number]    Orders Added: 1)  Flu Vaccine 71yrs + [30865] 2)  Admin 1st Vaccine [90471] 3)  Est. Patient Level III [78469] 4)  Capillary Blood Glucose/CBG [62952]   Immunizations Administered:  Influenza Vaccine # 1:    Vaccine Type: Fluvax 3+    Site: right deltoid    Mfr: GlaxoSmithKline    Dose: 0.5 ml    Route: IM    Given by: [name]    Exp. Date: 08/28/2010    Lot #: [id number]    VIS given: 09/22/09 version given March 25, 2010.  Flu Vaccine Consent Questions:    Do you have a history of severe allergic reactions to this vaccine? no    Any prior history of allergic reactions to egg and/or gelatin? no    Do you have a sensitivity to the preservative Thimersol? no    Do you have a past history of Guillan-Barre Syndrome? no    Do you currently have an acute febrile illness? no    Have you ever had a severe reaction to latex? no    Vaccine information given and explained to patient? yes    Are you currently pregnant? no    ndc  [phone]  Immunizations Administered:  Influenza Vaccine # 1:    Vaccine Type: Fluvax 3+    Site: right deltoid    Mfr: GlaxoSmithKline    Dose: 0.5 ml    Route: IM    Given by: [name]    Exp. Date: 08/28/2010     Lot #: [id number]    VIS given: 09/22/09 version given March 25, 2010.   Laboratory Results   Blood Tests   Date/Time Received: March 25, 2010 11:28 AM   Glucose (random): 99 mg/dL   (Normal Range: 87-564)

## 2010-06-03 LAB — MAGNESIUM: Magnesium: 2.2 mg/dL (ref 1.5–2.5)

## 2010-06-03 LAB — DIFFERENTIAL
Eosinophils Absolute: 0.4 10*3/uL (ref 0.0–0.7)
Eosinophils Relative: 2 % (ref 0–5)
Eosinophils Relative: 3 % (ref 0–5)
Lymphocytes Relative: 14 % (ref 12–46)
Lymphs Abs: 1.7 10*3/uL (ref 0.7–4.0)
Lymphs Abs: 2.7 10*3/uL (ref 0.7–4.0)
Monocytes Absolute: 0.8 10*3/uL (ref 0.1–1.0)
Monocytes Absolute: 0.9 10*3/uL (ref 0.1–1.0)
Monocytes Relative: 7 % (ref 3–12)
Monocytes Relative: 8 % (ref 3–12)

## 2010-06-03 LAB — COMPREHENSIVE METABOLIC PANEL
ALT: 9 U/L (ref 0–35)
AST: 15 U/L (ref 0–37)
AST: 17 U/L (ref 0–37)
Albumin: 2.9 g/dL — ABNORMAL LOW (ref 3.5–5.2)
Albumin: 3.5 g/dL (ref 3.5–5.2)
CO2: 26 mEq/L (ref 19–32)
Calcium: 8.4 mg/dL (ref 8.4–10.5)
Calcium: 9 mg/dL (ref 8.4–10.5)
Chloride: 99 mEq/L (ref 96–112)
Creatinine, Ser: 0.84 mg/dL (ref 0.4–1.2)
GFR calc Af Amer: >60 mL/min (ref >60)
GFR calc Af Amer: >60 mL/min (ref >60)
GFR calc non Af Amer: >60 mL/min (ref >60)
Sodium: 134 mEq/L — ABNORMAL LOW (ref 135–145)
Total Protein: 8.7 g/dL — ABNORMAL HIGH (ref 6.0–8.3)

## 2010-06-03 LAB — LUPUS ANTICOAGULANT PANEL
PTTLA 4:1 Mix: 43.1 secs (ref 36.3–48.8)
dRVVT Incubated 1:1 Mix: 40.3 secs (ref 36.1–47.0)

## 2010-06-03 LAB — CBC
MCHC: 33 g/dL (ref 30.0–36.0)
MCHC: 34 g/dL (ref 30.0–36.0)
MCV: 83.8 fL (ref 78.0–100.0)
Platelets: 335 10*3/uL (ref 150–400)
Platelets: 347 10*3/uL (ref 150–400)
RBC: 4.3 MIL/uL (ref 3.87–5.11)
WBC: 11.5 10*3/uL — ABNORMAL HIGH (ref 4.0–10.5)
WBC: 12.7 10*3/uL — ABNORMAL HIGH (ref 4.0–10.5)

## 2010-06-03 LAB — PROTEIN S, TOTAL: Protein S Ag, Total: 107 % (ref 70–140)

## 2010-06-03 LAB — BETA-2-GLYCOPROTEIN I ABS, IGG/M/A: Beta-2 Glyco I IgG: <3 U/mL (ref <=15)

## 2010-06-03 LAB — CARDIOLIPIN ANTIBODIES, IGG, IGM, IGA
Anticardiolipin IgA: <10 APL U/mL — ABNORMAL LOW (ref <10)
Anticardiolipin IgG: <3 GPL U/mL — ABNORMAL LOW (ref <10)

## 2010-06-03 LAB — APTT: aPTT: 34 seconds (ref 24–37)

## 2010-06-03 LAB — PROTHROMBIN GENE MUTATION

## 2010-06-03 LAB — PROTIME-INR: Prothrombin Time: 13.5 seconds (ref 11.6–15.2)

## 2010-06-03 LAB — ANTITHROMBIN III: AntiThromb III Func: 121 % — ABNORMAL HIGH (ref 76–126)

## 2010-06-03 LAB — PROTEIN C ACTIVITY: Protein C Activity: 159 % — ABNORMAL HIGH (ref 75–133)

## 2010-06-04 LAB — BASIC METABOLIC PANEL
Chloride: 106 mEq/L (ref 96–112)
GFR calc Af Amer: >60 mL/min (ref >60)
GFR calc non Af Amer: >60 mL/min (ref >60)
Potassium: 3.7 mEq/L (ref 3.5–5.1)
Sodium: 139 mEq/L (ref 135–145)

## 2010-06-04 LAB — CBC
HCT: 39.7 % (ref 36.0–46.0)
MCV: 84.7 fL (ref 78.0–100.0)
RBC: 4.69 MIL/uL (ref 3.87–5.11)
RDW: 14.2 % (ref 11.5–15.5)
WBC: 8.7 10*3/uL (ref 4.0–10.5)

## 2010-06-04 LAB — URINALYSIS, ROUTINE W REFLEX MICROSCOPIC
Glucose, UA: NEGATIVE mg/dL
Nitrite: NEGATIVE
Specific Gravity, Urine: 1.012 (ref 1.005–1.030)
pH: 6 (ref 5.0–8.0)

## 2010-06-04 LAB — DIFFERENTIAL
Basophils Absolute: 0 10*3/uL (ref 0.0–0.1)
Basophils Relative: 0 % (ref 0–1)
Monocytes Absolute: 0.6 10*3/uL (ref 0.1–1.0)
Neutro Abs: 6.2 10*3/uL (ref 1.7–7.7)
Neutrophils Relative %: 72 % (ref 43–77)

## 2011-03-07 ENCOUNTER — Other Ambulatory Visit: Payer: Self-pay | Admitting: Obstetrics and Gynecology

## 2011-03-07 DIAGNOSIS — Z1231 Encounter for screening mammogram for malignant neoplasm of breast: Secondary | ICD-10-CM

## 2011-03-08 ENCOUNTER — Ambulatory Visit (INDEPENDENT_AMBULATORY_CARE_PROVIDER_SITE_OTHER): Payer: Self-pay | Admitting: *Deleted

## 2011-03-08 ENCOUNTER — Ambulatory Visit: Payer: Self-pay

## 2011-03-08 ENCOUNTER — Ambulatory Visit (HOSPITAL_COMMUNITY)
Admission: RE | Admit: 2011-03-08 | Discharge: 2011-03-08 | Disposition: A | Discharge status: Home / Self Care | Payer: Self-pay | Source: Ambulatory Visit | Attending: Obstetrics and Gynecology | Admitting: Obstetrics and Gynecology

## 2011-03-08 ENCOUNTER — Encounter: Payer: Self-pay | Admitting: *Deleted

## 2011-03-08 VITALS — BP 131/88 | HR 84 | Temp 97.5°F | Resp 18 | Ht 68.0 in | Wt 222.3 lb

## 2011-03-08 DIAGNOSIS — Z1239 Encounter for other screening for malignant neoplasm of breast: Secondary | ICD-10-CM

## 2011-03-08 DIAGNOSIS — Z1231 Encounter for screening mammogram for malignant neoplasm of breast: Secondary | ICD-10-CM

## 2011-03-08 NOTE — Patient Instructions (Signed)
Taught patient how to perform BSE and gave educational materials to take home. Patient did not need a Pap smear today due to last Pap smear was 08/04/09 and was normal. Told patient about free cervical cancer screenings to receive a Pap smear if would like one at 2 years. Let her know [facility] will cover Pap smears every 3 years unless has a history of abnormal Pap smears. Patient escorted to mammography for screening mammogram. Let patient know will follow up with her within the next couple weeks with results by letter or phone. Patient verbalized understanding.

## 2011-03-08 NOTE — Progress Notes (Signed)
No complaints today.  Pap Smear:    Pap smear not performed today. Patients last Pap smear was 08/04/09 and was normal. Patient had a Pap smear 03/06/07 and was normal. Per patient she has no history of abnormal Pap smears. Patient will need next Pap smear 08/04/12 per [facility] guidelines. Pap smear results above is in EPIC.  Physical exam: Breasts Right breast larger than left breast. Large scar under right breast where patient had a breast reduction. Scar on left breast at 5 o'clock where patient had a lumpectomy. No nipple retraction bilateral breasts. No nipple discharge bilateral breasts. No lymphadenopathy. No lumps palpated bilateral breasts. No complaints of pain or tenderness on palpation.        Pelvic/Bimanual No Pap smear completed today since last Pap smear was 08/04/09 and normal. Pap smear not indicated per [facility] guidelines.

## 2013-01-05 ENCOUNTER — Ambulatory Visit (INDEPENDENT_AMBULATORY_CARE_PROVIDER_SITE_OTHER): Payer: BC Managed Care – PPO | Admitting: Family Medicine

## 2013-01-05 VITALS — BP 132/80 | HR 84 | Temp 98.1°F | Resp 18 | Ht 68.5 in | Wt 216.0 lb

## 2013-01-05 DIAGNOSIS — R112 Nausea with vomiting, unspecified: Secondary | ICD-10-CM

## 2013-01-05 DIAGNOSIS — H02843 Edema of right eye, unspecified eyelid: Secondary | ICD-10-CM

## 2013-01-05 DIAGNOSIS — H02849 Edema of unspecified eye, unspecified eyelid: Secondary | ICD-10-CM

## 2013-01-05 MED ORDER — ONDANSETRON 4 MG PO TBDP: 4.0000 mg | ORAL_TABLET | Freq: Three times a day (TID) | ORAL | Status: DC | PRN

## 2013-01-05 NOTE — Patient Instructions (Signed)
Sips of fluids today, zofran if needed for nausea. If eye gets worse, or feeling in back of throat not improving in next day or two - recheck. Return to the clinic or go to the nearest emergency room if any of your symptoms worsen or new symptoms occur.  Nausea and Vomiting Nausea is a sick feeling that often comes before throwing up (vomiting). Vomiting is a reflex where stomach contents come out of your mouth. Vomiting can cause severe loss of body fluids (dehydration). Children and elderly adults can become dehydrated quickly, especially if they also have diarrhea. Nausea and vomiting are symptoms of a condition or disease. It is important to find the cause of your symptoms. CAUSES   Direct irritation of the stomach lining. This irritation can result from increased acid production (gastroesophageal reflux disease), infection, food poisoning, taking certain medicines (such as nonsteroidal anti-inflammatory drugs), alcohol use, or tobacco use.  Signals from the brain.These signals could be caused by a headache, heat exposure, an inner ear disturbance, increased pressure in the brain from injury, infection, a tumor, or a concussion, pain, emotional stimulus, or metabolic problems.  An obstruction in the gastrointestinal tract (bowel obstruction).  Illnesses such as diabetes, hepatitis, gallbladder problems, appendicitis, kidney problems, cancer, sepsis, atypical symptoms of a heart attack, or eating disorders.  Medical treatments such as chemotherapy and radiation.  Receiving medicine that makes you sleep (general anesthetic) during surgery. DIAGNOSIS Your caregiver may ask for tests to be done if the problems do not improve after a few days. Tests may also be done if symptoms are severe or if the reason for the nausea and vomiting is not clear. Tests may include:  Urine tests.  Blood tests.  Stool tests.  Cultures (to look for evidence of infection).  X-rays or other imaging  studies. Test results can help your caregiver make decisions about treatment or the need for additional tests. TREATMENT You need to stay well hydrated. Drink frequently but in small amounts.You may wish to drink water, sports drinks, clear broth, or eat frozen ice pops or gelatin dessert to help stay hydrated.When you eat, eating slowly may help prevent nausea.There are also some antinausea medicines that may help prevent nausea. HOME CARE INSTRUCTIONS   Take all medicine as directed by your caregiver.  If you do not have an appetite, do not force yourself to eat. However, you must continue to drink fluids.  If you have an appetite, eat a normal diet unless your caregiver tells you differently.  Eat a variety of complex carbohydrates (rice, wheat, potatoes, bread), lean meats, yogurt, fruits, and vegetables.  Avoid high-fat foods because they are more difficult to digest.  Drink enough water and fluids to keep your urine clear or pale yellow.  If you are dehydrated, ask your caregiver for specific rehydration instructions. Signs of dehydration may include:  Severe thirst.  Dry lips and mouth.  Dizziness.  Dark urine.  Decreasing urine frequency and amount.  Confusion.  Rapid breathing or pulse. SEEK IMMEDIATE MEDICAL CARE IF:   You have blood or brown flecks (like coffee grounds) in your vomit.  You have black or bloody stools.  You have a severe headache or stiff neck.  You are confused.  You have severe abdominal pain.  You have chest pain or trouble breathing.  You do not urinate at least once every 8 hours.  You develop cold or clammy skin.  You continue to vomit for longer than 24 to 48 hours.  You have  a fever. MAKE SURE YOU:   Understand these instructions.  Will watch your condition.  Will get help right away if you are not doing well or get worse. Document Released: 02/14/2005 Document Revised: 05/09/2011 Document Reviewed:  07/14/2010 [facility] Patient Information 2014 [place], [place].

## 2013-01-05 NOTE — Progress Notes (Addendum)
Subjective:  This chart was scribed for Meredith Staggers, MD by Quintella Reichert, ED scribe.  This patient was seen in room [facility] Room 3 and the patient's care was started at 9:23 AM.  Authored by Silas Sacramento, MD.    Patient ID: Suzanne Baldwin, female    DOB: 03-Oct-1955, 57 y.o.   MRN: [id number]  Chief Complaint  Patient presents with   Emesis    x1 day   Sore Throat    "lump in the back of throat" x1day   Eye Problem    L eye swelling/irritation    HPI  ID: Suzanne Baldwin is a 57 y.o. female PCP: MARTIN,NYKEDTRA, NP  Pt presents with vomiting and right eye irritation that began yesterday.  Pt had oral surgery 3 days ago at [facility] to have several teeth removed subsequent to an infection.  She is on a soft diet.  She is taking ibuprofen as needed.  She is not on antibiotics.  She states that she was doing well until yesterday when several hours after eating soup and soft noodles for lunch she became nauseated and began vomiting.  Emesis lasted 10 minutes and she denies hematemesis.  She states that after "retching" she developed a sensation of having a "lump" of mucus in the back of her throat that she is unable to spit out.  She has a "gagging" feeling from this but has not had any difficulty breathing.  Currently this feeling has slightly improved since last night.  Later last night she began to feel as if the inside of her right eye was swollen and irritated   She denies visual changes.  This morning she again vomited a small amount one time but no other emesis today.  She denies fevers, abdominal pain, urinary symptoms, hematemesis, or diarrhea.  Last BM was yesterday morning.  Pt called her dentist this morning and was advised her symptoms are likely not related to the surgery.  She notes that last week she had rhinorrhea and a scratchy throat which she attributed to seasonal allergies.  She used Zyrtec.   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   HYPERCHOLESTEROLEMIA 03/06/2007   DERMATOPHYTOSIS, NAIL 02/01/2007   ALLERGIC RHINITIS 02/01/2007   NEOPLASM, MALIGNANT, BREAST, HX OF 02/01/2007   Past Medical History  Diagnosis Date   Breast disorder     breast cancer 25 years ago   Cancer    Clotting disorder    Past Surgical History  Procedure Laterality Date   Cesarean section     Fracture surgery     Cosmetic surgery     Tubal ligation     No Known Allergies Prior to Admission medications   Not on File   History   Social History   Marital Status: Single    Spouse Name: N/A    Number of Children: N/A   Years of Education: N/A   Occupational History   Not on file.   Social History Main Topics   Smoking status: Never Smoker    Smokeless tobacco: Not on file   Alcohol Use: No   Drug Use: No   Sexual Activity: No   Other Topics Concern   Not on file   Social History Narrative  No narrative on file     Review of Systems  Constitutional: Negative for fever.  HENT: Positive for rhinorrhea and sore throat ("scratchy").        Subjective "lump" in the back of throat  Eyes:       Right eye irritation and feeling of swelling  Respiratory: Negative for shortness of breath.   Gastrointestinal: Positive for nausea and vomiting. Negative for abdominal pain and diarrhea.  Genitourinary: Negative for dysuria, hematuria, decreased urine volume and difficulty urinating.       Objective:   Physical Exam  Nursing note and vitals reviewed. Constitutional: She is oriented to person, place, and time. She appears well-developed and well-nourished. No distress.  HENT:  Head: Normocephalic and atraumatic.  Right Ear: Hearing, tympanic  membrane, external ear and ear canal normal.  Left Ear: Hearing, tympanic membrane, external ear and ear canal normal.  Nose: Nose normal.  Mouth/Throat: Oropharynx is clear and moist and mucous membranes are normal. Mucous membranes are not dry. No oropharyngeal exudate.  No foreign body visible in mouth or throat  Eyes: Conjunctivae and EOM are normal. Pupils are equal, round, and reactive to light.  Very slight edema of right upper eyelid, without stye or FB.  Conjunctiva normal without injection.  Neck: Neck supple. No tracheal deviation present.  Cardiovascular: Normal rate, regular rhythm, normal heart sounds and intact distal pulses.   No murmur heard. Pulmonary/Chest: Effort normal and breath sounds normal. No stridor. No respiratory distress. She has no wheezes. She has no rhonchi. She has no rales.  Abdominal: Soft. Bowel sounds are increased. There is no tenderness.  Slightly hyperactive bowel sounds  Musculoskeletal: Normal range of motion.  Lymphadenopathy:    She has no cervical adenopathy.  Neurological: She is alert and oriented to person, place, and time.  Skin: Skin is warm and dry. No rash noted.  Psychiatric: She has a normal mood and affect. Her behavior is normal.     Filed Vitals:   01/05/13 0909  BP: 132/80  Pulse: 84  Temp: 98.1 F (36.7 C)  TempSrc: Oral  Resp: 18  Height: 5' 8.5" (1.74 m)  Weight: 216 lb (97.977 kg)  SpO2: 99%    Visual Acuity Screening   Right eye Left eye Both eyes  Without correction:     With correction: 20/20 20/20 20/15        Assessment & Plan:   Suzanne Baldwin is a 57 y.o. female N&V (nausea and vomiting) - Plan: ondansetron (ZOFRAN ODT) 4 MG disintegrating tablet - suspected viral GE s food borne illness fom dinner last night. Improved this am. ORT discussed, Zofran if needed, RTC precautions.  FB sensation could be from retching, but if not improved in next 24-48 hours - rtc for recheck. ER precautions.   Swelling of  eyelid, right - now improved. Vision ok, no eye pain/irritation. ? Early stye vs mucosal irritation now improved. warm compresses if needed. rtc precautions.   Meds ordered this encounter  Medications   ondansetron (ZOFRAN ODT) 4 MG disintegrating tablet    Sig: Take 1 tablet (4 mg total) by mouth every 8 (eight) hours as needed for nausea or vomiting.    Dispense:  10 tablet    Refill:  0   Patient Instructions  Sips of fluids today, zofran if needed for nausea. If eye gets worse, or feeling in back of throat not improving in next day or two - recheck. Return to the clinic or go to the nearest  emergency room if any of your symptoms worsen or new symptoms occur.  Nausea and Vomiting Nausea is a sick feeling that often comes before throwing up (vomiting). Vomiting is a reflex where stomach contents come out of your mouth. Vomiting can cause severe loss of body fluids (dehydration). Children and elderly adults can become dehydrated quickly, especially if they also have diarrhea. Nausea and vomiting are symptoms of a condition or disease. It is important to find the cause of your symptoms. CAUSES   Direct irritation of the stomach lining. This irritation can result from increased acid production (gastroesophageal reflux disease), infection, food poisoning, taking certain medicines (such as nonsteroidal anti-inflammatory drugs), alcohol use, or tobacco use.  Signals from the brain.These signals could be caused by a headache, heat exposure, an inner ear disturbance, increased pressure in the brain from injury, infection, a tumor, or a concussion, pain, emotional stimulus, or metabolic problems.  An obstruction in the gastrointestinal tract (bowel obstruction).  Illnesses such as diabetes, hepatitis, gallbladder problems, appendicitis, kidney problems, cancer, sepsis, atypical symptoms of a heart attack, or eating disorders.  Medical treatments such as chemotherapy and radiation.  Receiving  medicine that makes you sleep (general anesthetic) during surgery. DIAGNOSIS Your caregiver may ask for tests to be done if the problems do not improve after a few days. Tests may also be done if symptoms are severe or if the reason for the nausea and vomiting is not clear. Tests may include:  Urine tests.  Blood tests.  Stool tests.  Cultures (to look for evidence of infection).  X-rays or other imaging studies. Test results can help your caregiver make decisions about treatment or the need for additional tests. TREATMENT You need to stay well hydrated. Drink frequently but in small amounts.You may wish to drink water, sports drinks, clear broth, or eat frozen ice pops or gelatin dessert to help stay hydrated.When you eat, eating slowly may help prevent nausea.There are also some antinausea medicines that may help prevent nausea. HOME CARE INSTRUCTIONS   Take all medicine as directed by your caregiver.  If you do not have an appetite, do not force yourself to eat. However, you must continue to drink fluids.  If you have an appetite, eat a normal diet unless your caregiver tells you differently.  Eat a variety of complex carbohydrates (rice, wheat, potatoes, bread), lean meats, yogurt, fruits, and vegetables.  Avoid high-fat foods because they are more difficult to digest.  Drink enough water and fluids to keep your urine clear or pale yellow.  If you are dehydrated, ask your caregiver for specific rehydration instructions. Signs of dehydration may include:  Severe thirst.  Dry lips and mouth.  Dizziness.  Dark urine.  Decreasing urine frequency and amount.  Confusion.  Rapid breathing or pulse. SEEK IMMEDIATE MEDICAL CARE IF:   You have blood or brown flecks (like coffee grounds) in your vomit.  You have black or bloody stools.  You have a severe headache or stiff neck.  You are confused.  You have severe abdominal pain.  You have chest pain or trouble  breathing.  You do not urinate at least once every 8 hours.  You develop cold or clammy skin.  You continue to vomit for longer than 24 to 48 hours.  You have a fever. MAKE SURE YOU:   Understand these instructions.  Will watch your condition.  Will get help right away if you are not doing well or get worse. Document Released: 02/14/2005 Document Revised: 05/09/2011  Document Reviewed: 07/14/2010 [facility] Patient Information 2014 [place], [place].

## 2014-09-12 ENCOUNTER — Encounter: Payer: Self-pay | Admitting: Internal Medicine

## 2017-01-03 ENCOUNTER — Encounter (HOSPITAL_COMMUNITY): Payer: Self-pay

## 2018-03-05 ENCOUNTER — Encounter: Payer: Self-pay | Admitting: Internal Medicine

## 2018-03-12 ENCOUNTER — Encounter: Payer: Self-pay | Admitting: Internal Medicine

## 2018-03-22 ENCOUNTER — Encounter: Payer: Self-pay | Admitting: Internal Medicine

## 2018-03-22 ENCOUNTER — Ambulatory Visit (AMBULATORY_SURGERY_CENTER): Payer: Self-pay

## 2018-03-22 VITALS — Ht 68.0 in | Wt 211.6 lb

## 2018-03-22 DIAGNOSIS — Z85038 Personal history of other malignant neoplasm of large intestine: Secondary | ICD-10-CM

## 2018-03-22 NOTE — Progress Notes (Signed)
Denies allergies to eggs or soy products. Denies complication of anesthesia or sedation. Denies use of weight loss medication. Denies use of O2.   Emmi instructions declined.  

## 2018-04-05 ENCOUNTER — Encounter: Payer: Self-pay | Admitting: Internal Medicine

## 2019-11-16 ENCOUNTER — Emergency Department (HOSPITAL_COMMUNITY): Payer: Medicare Other

## 2019-11-16 ENCOUNTER — Other Ambulatory Visit: Payer: Self-pay

## 2019-11-16 ENCOUNTER — Inpatient Hospital Stay (HOSPITAL_COMMUNITY)
Admission: EM | Admit: 2019-11-16 | Discharge: 2019-11-18 | DRG: 300 | Disposition: A | Discharge status: Home Health | Payer: Medicare Other | Attending: Internal Medicine | Admitting: Internal Medicine

## 2019-11-16 ENCOUNTER — Encounter (HOSPITAL_COMMUNITY): Payer: Self-pay | Admitting: Emergency Medicine

## 2019-11-16 DIAGNOSIS — Z20822 Contact with and (suspected) exposure to covid-19: Secondary | ICD-10-CM | POA: Diagnosis present

## 2019-11-16 DIAGNOSIS — E1159 Type 2 diabetes mellitus with other circulatory complications: Secondary | ICD-10-CM | POA: Diagnosis not present

## 2019-11-16 DIAGNOSIS — I161 Hypertensive emergency: Secondary | ICD-10-CM | POA: Diagnosis present

## 2019-11-16 DIAGNOSIS — Z6831 Body mass index (BMI) 31.0-31.9, adult: Secondary | ICD-10-CM | POA: Diagnosis not present

## 2019-11-16 DIAGNOSIS — J351 Hypertrophy of tonsils: Secondary | ICD-10-CM | POA: Diagnosis present

## 2019-11-16 DIAGNOSIS — D6859 Other primary thrombophilia: Secondary | ICD-10-CM | POA: Diagnosis not present

## 2019-11-16 DIAGNOSIS — Z853 Personal history of malignant neoplasm of breast: Secondary | ICD-10-CM | POA: Diagnosis not present

## 2019-11-16 DIAGNOSIS — G459 Transient cerebral ischemic attack, unspecified: Secondary | ICD-10-CM | POA: Diagnosis present

## 2019-11-16 DIAGNOSIS — E119 Type 2 diabetes mellitus without complications: Secondary | ICD-10-CM | POA: Diagnosis present

## 2019-11-16 DIAGNOSIS — H4010X0 Unspecified open-angle glaucoma, stage unspecified: Secondary | ICD-10-CM | POA: Diagnosis present

## 2019-11-16 DIAGNOSIS — E669 Obesity, unspecified: Secondary | ICD-10-CM | POA: Diagnosis present

## 2019-11-16 DIAGNOSIS — D573 Sickle-cell trait: Secondary | ICD-10-CM | POA: Diagnosis present

## 2019-11-16 DIAGNOSIS — I6502 Occlusion and stenosis of left vertebral artery: Secondary | ICD-10-CM | POA: Diagnosis present

## 2019-11-16 DIAGNOSIS — I7774 Dissection of vertebral artery: Principal | ICD-10-CM | POA: Diagnosis present

## 2019-11-16 DIAGNOSIS — E785 Hyperlipidemia, unspecified: Secondary | ICD-10-CM | POA: Diagnosis present

## 2019-11-16 DIAGNOSIS — Z86718 Personal history of other venous thrombosis and embolism: Secondary | ICD-10-CM

## 2019-11-16 DIAGNOSIS — R29700 NIHSS score 0: Secondary | ICD-10-CM | POA: Diagnosis present

## 2019-11-16 DIAGNOSIS — I639 Cerebral infarction, unspecified: Secondary | ICD-10-CM | POA: Diagnosis not present

## 2019-11-16 DIAGNOSIS — I10 Essential (primary) hypertension: Secondary | ICD-10-CM | POA: Diagnosis present

## 2019-11-16 DIAGNOSIS — E7800 Pure hypercholesterolemia, unspecified: Secondary | ICD-10-CM | POA: Diagnosis present

## 2019-11-16 LAB — BASIC METABOLIC PANEL
Anion gap: 8 (ref 5–15)
BUN: 9 mg/dL (ref 8–23)
CO2: 27 mmol/L (ref 22–32)
Calcium: 9.6 mg/dL (ref 8.9–10.3)
Chloride: 103 mmol/L (ref 98–111)
Creatinine, Ser: 0.8 mg/dL (ref 0.44–1.00)
GFR calc Af Amer: >60 mL/min (ref >60)
GFR calc non Af Amer: >60 mL/min (ref >60)
Glucose, Bld: 110 mg/dL — ABNORMAL HIGH (ref 70–99)
Potassium: 4.4 mmol/L (ref 3.5–5.1)
Sodium: 138 mmol/L (ref 135–145)

## 2019-11-16 LAB — URINALYSIS, ROUTINE W REFLEX MICROSCOPIC
Bilirubin Urine: NEGATIVE
Glucose, UA: NEGATIVE mg/dL
Hgb urine dipstick: NEGATIVE
Ketones, ur: NEGATIVE mg/dL
Leukocytes,Ua: NEGATIVE
Nitrite: NEGATIVE
Protein, ur: NEGATIVE mg/dL
Specific Gravity, Urine: 1.006 (ref 1.005–1.030)
pH: 5 (ref 5.0–8.0)

## 2019-11-16 LAB — CBC
HCT: 46.6 % — ABNORMAL HIGH (ref 36.0–46.0)
Hemoglobin: 15 g/dL (ref 12.0–15.0)
MCH: 26.9 pg (ref 26.0–34.0)
MCHC: 32.2 g/dL (ref 30.0–36.0)
MCV: 83.7 fL (ref 80.0–100.0)
Platelets: 374 10*3/uL (ref 150–400)
RBC: 5.57 MIL/uL — ABNORMAL HIGH (ref 3.87–5.11)
RDW: 14.3 % (ref 11.5–15.5)
WBC: 8.1 10*3/uL (ref 4.0–10.5)
nRBC: 0 % (ref 0.0–0.2)

## 2019-11-16 LAB — TROPONIN I (HIGH SENSITIVITY)
Troponin I (High Sensitivity): 4 ng/L (ref <18)
Troponin I (High Sensitivity): 6 ng/L (ref <18)

## 2019-11-16 LAB — HEMOGLOBIN A1C
Hgb A1c MFr Bld: 6.6 % — ABNORMAL HIGH (ref 4.8–5.6)
Mean Plasma Glucose: 142.72 mg/dL

## 2019-11-16 LAB — SARS CORONAVIRUS 2 BY RT PCR (HOSPITAL ORDER, PERFORMED IN ~~LOC~~ HOSPITAL LAB): SARS Coronavirus 2: NEGATIVE

## 2019-11-16 LAB — TSH: TSH: 1.946 u[IU]/mL (ref 0.350–4.500)

## 2019-11-16 LAB — CBG MONITORING, ED
Glucose-Capillary: 105 mg/dL — ABNORMAL HIGH (ref 70–99)
Glucose-Capillary: 153 mg/dL — ABNORMAL HIGH (ref 70–99)

## 2019-11-16 MED ORDER — INSULIN ASPART 100 UNIT/ML ~~LOC~~ SOLN
0.0000 [IU] | SUBCUTANEOUS | Status: DC
  Administered 2019-11-17 (×2): 3 [IU] via SUBCUTANEOUS
  Administered 2019-11-17 – 2019-11-18 (×2): 2 [IU] via SUBCUTANEOUS

## 2019-11-16 MED ORDER — IOHEXOL 350 MG/ML SOLN
60.0000 mL | Freq: Once | INTRAVENOUS | Status: AC | PRN
  Administered 2019-11-16: 60 mL via INTRAVENOUS

## 2019-11-16 MED ORDER — PROCHLORPERAZINE EDISYLATE 10 MG/2ML IJ SOLN
5.0000 mg | Freq: Once | INTRAMUSCULAR | Status: AC
  Administered 2019-11-16: 5 mg via INTRAVENOUS
  Filled 2019-11-16: qty 2

## 2019-11-16 MED ORDER — ATORVASTATIN CALCIUM 40 MG PO TABS
40.0000 mg | ORAL_TABLET | Freq: Every day | ORAL | Status: DC
  Administered 2019-11-17: 40 mg via ORAL
  Filled 2019-11-16: qty 1
  Filled 2019-11-16: qty 4

## 2019-11-16 MED ORDER — ASPIRIN EC 81 MG PO TBEC
81.0000 mg | DELAYED_RELEASE_TABLET | Freq: Every day | ORAL | Status: DC
  Administered 2019-11-17 – 2019-11-18 (×2): 81 mg via ORAL
  Filled 2019-11-16 (×2): qty 1

## 2019-11-16 MED ORDER — POLYETHYLENE GLYCOL 3350 17 G PO PACK: 17.0000 g | PACK | Freq: Every day | ORAL | Status: DC | PRN

## 2019-11-16 MED ORDER — DEXAMETHASONE SODIUM PHOSPHATE 4 MG/ML IJ SOLN
4.0000 mg | Freq: Once | INTRAMUSCULAR | Status: AC
  Administered 2019-11-16: 4 mg via INTRAVENOUS
  Filled 2019-11-16: qty 1

## 2019-11-16 MED ORDER — TETRACAINE HCL 0.5 % OP SOLN
1.0000 [drp] | Freq: Once | OPHTHALMIC | Status: AC
  Administered 2019-11-16: 1 [drp] via OPHTHALMIC
  Filled 2019-11-16: qty 4

## 2019-11-16 MED ORDER — SODIUM CHLORIDE 0.9% FLUSH: 3.0000 mL | Freq: Once | INTRAVENOUS | Status: DC

## 2019-11-16 MED ORDER — ASPIRIN 81 MG PO CHEW
324.0000 mg | CHEWABLE_TABLET | Freq: Once | ORAL | Status: AC
  Administered 2019-11-16: 324 mg via ORAL
  Filled 2019-11-16: qty 4

## 2019-11-16 MED ORDER — SODIUM CHLORIDE 0.9 % IV BOLUS
500.0000 mL | Freq: Once | INTRAVENOUS | Status: AC
  Administered 2019-11-16: 500 mL via INTRAVENOUS

## 2019-11-16 MED ORDER — DIPHENHYDRAMINE HCL 50 MG/ML IJ SOLN
12.5000 mg | Freq: Once | INTRAMUSCULAR | Status: AC
  Administered 2019-11-16: 12.5 mg via INTRAVENOUS
  Filled 2019-11-16: qty 1

## 2019-11-16 NOTE — H&P (Addendum)
Date: 11/17/2019               Patient Name:  Suzanne Baldwin MRN: [id number]  DOB: 1955/09/22 Age / Sex: 64 y.o., female   PCP: Bartholome Bill, MD         Medical Service: Internal Medicine Teaching Service         Attending Physician: Dr. Evette Doffing, Mallie Mussel, *    First Contact: Dr. Jeralyn Bennett Pager: [phone]  Second Contact: Dr. Linna Hoff Pager: [phone]       After Hours (After 5p/  First Contact Pager: [phone]  weekends / holidays): Second Contact Pager: [phone]   Chief Complaint: Dizziness, palpitations, nausea, hypertension  History of Present Illness: [name] is a 64 year old female with past medical history of HTN, HLD, pre-diabetes, and open-angle glaucoma who presents to [facility] for evaluation of dizziness, palpitations, nausea, and hypertension.  Patient states that two weeks ago, she visited [place] to attend a wedding. While she was there, she experienced two brief episodes of dizziness that lasted only for a few seconds. During both episodes, she felt that she was losing her footing, but otherwise had no other symptoms. She attributed these episodes to being "jet lagged." Upon return from her trip, she felt in her normal state of health until two nights ago when she began to develop the sensation of feeling "jittery" and feeling like her heart was "beating fast." This sensation persisted throughout the entirety of the evening. She checked her blood pressure using her home blood pressure cuff which revealed elevated blood pressure 148/90, which is high for her. She went to bed early to sleep off the symptoms. Upon awakening yesterday morning, she felt nauseous and dizzy. Later in the day, upon standing from the couch, her dizziness worsened and she experienced a fall. She did not hit her head or injure herself from the fall. She checked her blood pressure which was elevated to 165/100. Shortly after the incident, she developed a headache  localized to the posterior, right aspect of her head that lasted for a few minutes. This morning, her symptoms persisted so she called her PCP's office who recommended that she come to the ED for evaluation.  ED Course: On arrival to the ED, patient endorsed a severe, left sided, throbbing headache. She was hypertensive (177/126) with heart rate of 99. EKG revealed normal sinus rhythm. Initial labs (CBC, BMP, TSH, CBG, UA, troponin) were unremarkable. 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 She received compazine, decadron, benadryl for her headache, 500cc bolus of normal saline, and aspirin 325. Neurology was consulted for recommendations, and she was admitted to IMTS for further evaluation and management of her condition.  Meds:  No current facility-administered medications on file prior to encounter.   Current Outpatient Medications on File Prior to Encounter  Medication Sig Dispense Refill  . amLODipine (NORVASC) 10 MG tablet Take 10 mg by mouth daily.    . brimonidine (ALPHAGAN) 0.2 % ophthalmic solution Place 1 drop into both eyes 3 (three) times daily.    . brimonidine-timolol (COMBIGAN) 0.2-0.5 % ophthalmic solution Place 1 drop into both eyes 2 (two) times daily.     . Cholecalciferol (VITAMIN D-3) 125 MCG (5000 UT) TABS Take 5,000 Units by mouth daily.    . Fluocinolone Acetonide Body 0.01 % OIL Apply 1 application topically every Monday, Wednesday, and Friday. Apply to thin  patches on scalp    . latanoprost (XALATAN) 0.005 % ophthalmic solution Place 1 drop into both eyes at bedtime.    . meloxicam (MOBIC) 15 MG tablet Take 15 mg by mouth daily as needed for pain.     Marland Kitchen nystatin cream (MYCOSTATIN) Apply 1 application topically See admin instructions. Apply externally to genital area twice daily (mix with triamcinolone cream)    . triamcinolone cream (KENALOG) 0.1 % Apply 1 application  topically See admin instructions. Apply externally to genital area twice daily (mix with nystatin cream)    . atorvastatin (LIPITOR) 10 MG tablet Take 10 mg by mouth daily.    . timolol (TIMOPTIC) 0.5 % ophthalmic solution Place 1 drop into both eyes daily.     Allergies: Allergies as of 11/16/2019  . (No Known Allergies)   Past Medical History:  Diagnosis Date  . Allergy   . Arthritis   . Breast disorder    breast cancer 25 years ago  . Cancer ([place])   . Clotting disorder ([place])   . Diabetes mellitus without complication ([place])   . Glaucoma   . Hyperlipidemia   . Hypertension   . Sickle cell trait ([place])    Family History:  Family History  Problem Relation Age of Onset  . Diabetes Father   . Hypertension Father   . Stroke Father   . Diabetes Mother   . Hypertension Mother   . Breast cancer Mother   . Cancer Mother   . Heart disease Maternal Grandmother   . Colon cancer Neg Hx   . Esophageal cancer Neg Hx   . Stomach cancer Neg Hx   . Rectal cancer Neg Hx    Social History:  Social History   Tobacco Use  . Smoking status: Never Smoker  . Smokeless tobacco: Never Used  Substance Use Topics  . Alcohol use: No  . Drug use: No  Lives in [place] with daughter  Review of Systems: A complete ROS was negative except as per HPI.  Review of Systems  Constitutional: Negative for chills and fever.  Eyes: Negative for blurred vision.  Respiratory: Negative for cough and shortness of breath.   Cardiovascular: Positive for palpitations. Negative for chest pain and leg swelling.  Gastrointestinal: Positive for nausea. Negative for abdominal pain, diarrhea and vomiting.  Musculoskeletal: Positive for back pain. Negative for neck pain.  Neurological: Positive for dizziness. Negative for loss of consciousness, weakness and headaches.   Physical Exam: Blood pressure 127/80, pulse 70, temperature 98.9 F (37.2 C), temperature source Oral, resp. rate 17, height 5\' 8"  (1.727 m),  weight 92.5 kg, SpO2 97 %. Physical Exam Constitutional:      General: She is not in acute distress.    Appearance: Normal appearance. She is not ill-appearing.  HENT:     Head: Normocephalic and atraumatic.  Eyes:     Extraocular Movements: Extraocular movements intact.     Conjunctiva/sclera: Conjunctivae normal.     Pupils: Pupils are equal, round, and reactive to light.  Cardiovascular:     Rate and Rhythm: Normal rate and regular rhythm.     Pulses: Normal pulses.     Heart sounds: Normal heart sounds. No murmur heard.  No friction rub.  Pulmonary:     Effort: Pulmonary effort is normal. No respiratory distress.     Breath sounds: Normal breath sounds.  Abdominal:     General: Abdomen is flat. Bowel sounds are normal.     Palpations: Abdomen is soft.  Tenderness: There is no abdominal tenderness.  Musculoskeletal:        General: No tenderness. Normal range of motion.     Cervical back: Normal range of motion and neck supple.     Right lower leg: No edema.     Left lower leg: No edema.  Skin:    General: Skin is warm and dry.     Capillary Refill: Capillary refill takes less than 2 seconds.  Neurological:     General: No focal deficit present.     Mental Status: She is alert and oriented to person, place, and time. Mental status is at baseline.     Cranial Nerves: No cranial nerve deficit.     Sensory: No sensory deficit.     Motor: No weakness.     Coordination: Coordination normal.  Psychiatric:        Mood and Affect: Mood normal.        Behavior: Behavior normal.        Thought Content: Thought content normal.        Judgment: Judgment normal.    EKG: personally reviewed my interpretation is normal sinus rhythm  Assessment & Plan by Problem: Active Problems:   Vertebral artery dissection (HCC)  #Left vertebral artery dissection Patient presents to ED with complaints of episodic dizziness, persistent nausea, acute onset headache, and hypertension over  the past three days. Her neurological examination is unremarkable for any obvious deficit. CTA Head and Neck reveals left-sided vertebral artery dissection. Currently, patient reports that she feels well and has no complaints at this time. Contributing factors for her vertebral artery dissection include hypertension, hyperlipidemia, prediabetes, and age. Neurology has been consulted, and we appreciate their recommendations. Given that patient is stable, she does not require emergency surgical intervention and should do well with medical management of this condition. -ASA 81mg  daily -Lipitor 40mg  (home medication) -Lipid Panel -Echocardiogram -Vas US Carotid -Telemetry -AM CBC, BMP -PT/OP/SLP eval and treat -Follow-up neurology recommendations  #HTN Patient's blood pressure mildly improved since arrival, however continues to remain elevated (most recently 127/80. -Hold home amlodipine  #History of prediabetes Patient reports history of prediabetes requiring metformin in the past. Due to lifestyle modifications, she was taken off of this medication. She routinely checks her blood sugar at home which reportedly is always within normal limits.  -HbA1c -CBGs -SSI  #Asymmetric enlargement of the left palatine tonsil Identified incidentally on CTA Head and Neck. Recommended outpatient ENT follow-up. -Referral to ENT upon discharge.  Code status: Full Diet: NPO IVF: none VTE ppx: SCDs  Dispo: Admit patient to Inpatient with expected length of stay greater than 2 midnights.  Signed: Cato Mulligan, MD 11/17/2019, 12:07 AM  Pager: [phone] After 5pm on weekdays and 1pm on weekends: On Call pager: [phone]

## 2019-11-16 NOTE — ED Notes (Signed)
Pt walking to the bathroom no unsteady gate or dizziness when standing/walking.

## 2019-11-16 NOTE — ED Triage Notes (Signed)
Pt reports headache, nausea, increased BP, and dizziness since yesterday.  States she is taking her BP medications.  No arm drift.

## 2019-11-16 NOTE — ED Notes (Addendum)
Tonopen at bedside, EDP aware.

## 2019-11-16 NOTE — ED Provider Notes (Signed)
[place] EMERGENCY DEPARTMENT Provider Note   CSN: [id number] Arrival date & time: 11/16/19  1405     History Chief Complaint  Patient presents with  . Dizziness  . Hypertension    Suzanne Baldwin is a 64 y.o. female. Who presents emergency department with chief complaint dizziness and heart palpitations. Patient has multiple complaints. 1) patient complains that she has been having intermittent episodes of vertigo lasting several seconds at a time with associated disequilibrium. The episodes began 2 weeks ago after she took a flight to [facility] for a wedding. She had 2 episodes that lasted a few seconds. She has had episodes over the past 4 days associated with a generalized feeling of malaise, fatigue and nausea. The dizziness is worse when she leans forward. Yesterday she lost her balance and fell. She did not have any injuries and does not remember hitting her head. 2) the patient complains of headache. She describes the pain as throbbing, unilateral and left-sided. She states that nothing seems to make the pain worse or better. She denies a fever, neck stiffness, changes in vision, photophobia, phonophobia. Yesterday the patient had severe sharp pain in her neck. 3) the patient has also noticed episodes of heart palpitations with associated feelings of presyncope. She has had persistent low-grade nausea over the past 4 days with decreased appetite. She denies chest pain, pressure, exertional dyspnea, orthopnea or PND.she denies abdominal pain,vomiting, diarrhea, or constipation. 4) she noticed that her blood pressure has high oh her home monitor 160/110 and was very concerned about this.  HPI     Past Medical History:  Diagnosis Date  . Allergy   . Arthritis   . Breast disorder    breast cancer 25 years ago  . Cancer ([place])   . Clotting disorder ([place])   . Diabetes mellitus without complication ([place])   . Glaucoma   . Hyperlipidemia   . Hypertension   .  Sickle cell trait [facility])     Patient Active Problem List   Diagnosis Date Noted  . NAUSEA 03/25/2010  . PREHYPERTENSION 03/25/2010  . POSTMENOPAUSAL STATUS 11/20/2009  . FOOT PAIN, LEFT 09/23/2009  . OBESITY 08/04/2009  . DEEP VENOUS THROMBOPHLEBITIS, LEG, RIGHT 12/23/2008  . FRACTURE, ANKLE, RIGHT 10/29/2008  . TRICHOMONIASIS 03/06/2007  . HYPERCHOLESTEROLEMIA 03/06/2007  . DERMATOPHYTOSIS, NAIL 02/01/2007  . ALLERGIC RHINITIS 02/01/2007  . NEOPLASM, MALIGNANT, BREAST, HX OF 02/01/2007    Past Surgical History:  Procedure Laterality Date  . CESAREAN SECTION    . COLONOSCOPY    . COSMETIC SURGERY    . FRACTURE SURGERY    . POLYPECTOMY    . ROTATOR CUFF REPAIR    . TUBAL LIGATION       OB History    Gravida  5   Para      Term      Preterm      AB  2   Living  3     SAB      TAB      Ectopic      Multiple      Live Births              Family History  Problem Relation Age of Onset  . Diabetes Father   . Hypertension Father   . Stroke Father   . Diabetes Mother   . Hypertension Mother   . Breast cancer Mother   . Cancer Mother   . Heart disease Maternal Grandmother   .  Colon cancer Neg Hx   . Esophageal cancer Neg Hx   . Stomach cancer Neg Hx   . Rectal cancer Neg Hx     Social History   Tobacco Use  . Smoking status: Never Smoker  . Smokeless tobacco: Never Used  Substance Use Topics  . Alcohol use: No  . Drug use: No    Home Medications Prior to Admission medications   Medication Sig Start Date End Date Taking? Authorizing Provider  amlodipine-atorvastatin (CADUET) 10-10 MG tablet Take 1 tablet by mouth daily.    [provider]  brimonidine-timolol (COMBIGAN) 0.2-0.5 % ophthalmic solution Place 1 drop into both eyes every 12 (twelve) hours.    [provider]  OVER THE COUNTER MEDICATION Biotin Gummies 1000 mcg one daily.    [provider]  OVER THE COUNTER MEDICATION Vitamin D 2 50,000 units once a  week.    [provider]  polyethylene glycol powder (GLYCOLAX/MIRALAX) powder Take 1 Container by mouth once.    [provider]  travoprost, benzalkonium, (TRAVATAN) 0.004 % ophthalmic solution Place 1 drop into both eyes at bedtime.    [provider]    Allergies    Patient has no known allergies.  Review of Systems   Review of Systems Ten systems reviewed and are negative for acute change, except as noted in the HPI.   Physical Exam Updated Vital Signs BP 138/80   Pulse 71   Temp 98.9 F (37.2 C) (Oral)   Resp 14   Ht 5\' 8"  (1.727 m)   Wt 92.5 kg   SpO2 100%   BMI 31.02 kg/m   Physical Exam Vitals and nursing note reviewed.  Constitutional:      General: She is not in acute distress.    Appearance: She is well-developed. She is not diaphoretic.  HENT:     Head: Normocephalic and atraumatic.  Eyes:     General: No scleral icterus.    Conjunctiva/sclera: Conjunctivae normal.     Pupils: Pupils are equal, round, and reactive to light.     Comments: No horizontal, vertical or rotational nystagmus  Neck:     Comments: Full active and passive ROM without pain No midline or paraspinal tenderness No nuchal rigidity or meningeal signs Cardiovascular:     Rate and Rhythm: Normal rate and regular rhythm.     Heart sounds: Normal heart sounds. No murmur heard.  No friction rub. No gallop.   Pulmonary:     Effort: Pulmonary effort is normal. No respiratory distress.     Breath sounds: Normal breath sounds. No wheezing or rales.  Abdominal:     General: Bowel sounds are normal. There is no distension.     Palpations: Abdomen is soft. There is no mass.     Tenderness: There is no abdominal tenderness. There is no guarding or rebound.  Musculoskeletal:        General: Normal range of motion.     Cervical back: Normal range of motion and neck supple.  Lymphadenopathy:     Cervical: No cervical adenopathy.  Skin:    General: Skin is warm and  dry.     Findings: No rash.  Neurological:     General: No focal deficit present.     Mental Status: She is alert and oriented to person, place, and time.     Cranial Nerves: No cranial nerve deficit.     Motor: No abnormal muscle tone.     Coordination: Coordination  normal.     Comments: Mental Status:  Alert, oriented, thought content appropriate. Speech fluent without evidence of aphasia. Able to follow 2 step commands without difficulty.  Cranial Nerves:  II:  Peripheral visual fields grossly normal, pupils equal, round, reactive to light III,IV, VI: ptosis not present, extra-ocular motions intact bilaterally  V,VII: smile symmetric, facial light touch sensation equal VIII: hearing grossly normal bilaterally  IX,X: midline uvula rise  XI: bilateral shoulder shrug equal and strong XII: midline tongue extension  Motor:  5/5 in upper and lower extremities bilaterally including strong and equal grip strength and dorsiflexion/plantar flexion Sensory: Pinprick and light touch normal in all extremities.  Cerebellar: normal finger-to-nose with bilateral upper extremities Gait: normal gait and balance CV: distal pulses palpable throughout   Psychiatric:        Behavior: Behavior normal.        Thought Content: Thought content normal.        Judgment: Judgment normal.     ED Results / Procedures / Treatments   Labs (all labs ordered are listed, but only abnormal results are displayed) Labs Reviewed  BASIC METABOLIC PANEL - Abnormal; Notable for the following components:      Result Value   Glucose, Bld 110 (*)    All other components within normal limits  CBC - Abnormal; Notable for the following components:   RBC 5.57 (*)    HCT 46.6 (*)    All other components within normal limits  CBG MONITORING, ED - Abnormal; Notable for the following components:   Glucose-Capillary 105 (*)    All other components within normal limits  URINALYSIS, ROUTINE W REFLEX MICROSCOPIC  TSH   TROPONIN I (HIGH SENSITIVITY)    EKG EKG Interpretation  Date/Time:  Saturday November 16 2019 15:11:12 EDT Ventricular Rate:  74 PR Interval:  154 QRS Duration: 95 QT Interval:  396 QTC Calculation: 440 R Axis:   64 Text Interpretation: Normal sinus rhythm Poor R wave progression No significant change since last tracing EARLIER SAME DATE Confirmed by Pattricia Boss [phone]) on 11/16/2019 3:49:57 PM   Radiology No results found.  Procedures .Critical Care Performed by: Margarita Mail, PA-C Authorized by: Margarita Mail, PA-C   Critical care provider statement:    Critical care time (minutes):  45   Critical care was time spent personally by me on the following activities:  Discussions with consultants, evaluation of patient's response to treatment, examination of patient, ordering and performing treatments and interventions, ordering and review of laboratory studies, ordering and review of radiographic studies, pulse oximetry, re-evaluation of patient's condition, obtaining history from patient or surrogate and review of old charts   (including critical care time)  Medications Ordered in ED Medications  sodium chloride flush (NS) 0.9 % injection 3 mL (0 mLs Intravenous Hold 11/16/19 1505)  sodium chloride 0.9 % bolus 500 mL (has no administration in time range)  prochlorperazine (COMPAZINE) injection 5 mg (has no administration in time range)  diphenhydrAMINE (BENADRYL) injection 12.5 mg (has no administration in time range)  dexamethasone (DECADRON) injection 4 mg (has no administration in time range)  tetracaine (PONTOCAINE) 0.5 % ophthalmic solution 1 drop (has no administration in time range)    ED Course  I have reviewed the triage vital signs and the nursing notes.  Pertinent labs & imaging results that were available during my care of the patient were reviewed by me and considered in my medical decision making (see chart for details).    MDM  Rules/Calculators/A&P                          [id number], vertigo, palpitaitons VS: BP 121/87 (BP Location: Left Arm)   Pulse 62   Temp 97.8 F (36.6 C) (Oral)   Resp 20   Ht 5\' 8"  (1.727 m)   Wt 92.5 kg   SpO2 100%   BMI 31.02 kg/m   IR:CVELFYB is gathered by patient and EMR. Previous records obtained and reviewed. DDX:The patient's complaint of headache involves an extensive number of diagnostic and treatment options, and is a complaint that carries with it a high risk of complications, morbidity, and potential mortality. Given the large differential diagnosis, medical decision making is of high complexity. Emergent considerations for headache include subarachnoid hemorrhage, meningitis, temporal arteritis, glaucoma, cerebral ischemia, carotid/vertebral dissection, intracranial tumor, Venous sinus thrombosis, carbon monoxide poisoning, acute or chronic subdural hemorrhage.  Other considerations include: Migraine, Cluster headache, Hypertension, Caffeine, alcohol, or drug withdrawal, Pseudotumor cerebri, Arteriovenous malformation, Head injury, Neurocysticercosis, Post-lumbar puncture, Preeclampsia, Tension headache, Sinusitis, Cervical arthritis, Refractive error causing strain, Dental abscess, Otitis media, Temporomandibular joint syndrome, Depression, Somatoform disorder (eg, somatization) Trigeminal neuralgia, Glossopharyngeal neuralgia. Labs: I ordered reviewed and interpreted labs which included CBC/ BMP without abnormality Covid is pending. UA without infection. Imaging: I ordered and reviewed images which included CTA head and neck . I independently visualized and interpreted all imaging. Significant findings include Left Vertebral artery dissection. EKG: NSR at a rate of 74  Consults: Kirkpatrick and Aroor with Neurology, OFB:[id number] with headache and vertigo- she has an apparent Vertebral artery dissection. The patient will be admitted to the hospital.  Patient disposition:  admit The patient appears reasonably stabilized for admission considering the current resources, flow, and capabilities available in the ED at this time, and I doubt any other [facility] requiring further screening and/or treatment in the ED prior to admission.        Final Clinical Impression(s) / ED Diagnoses Final diagnoses:  None    Rx / DC Orders ED Discharge Orders    None       Margarita Mail, PA-C 11/18/19 1321    Pattricia Boss, MD 11/19/19 [phone]

## 2019-11-17 ENCOUNTER — Encounter (HOSPITAL_COMMUNITY): Payer: Medicare Other

## 2019-11-17 ENCOUNTER — Inpatient Hospital Stay (HOSPITAL_COMMUNITY): Payer: Medicare Other

## 2019-11-17 ENCOUNTER — Encounter (HOSPITAL_COMMUNITY): Payer: Self-pay | Admitting: Radiology

## 2019-11-17 DIAGNOSIS — G459 Transient cerebral ischemic attack, unspecified: Secondary | ICD-10-CM | POA: Diagnosis present

## 2019-11-17 DIAGNOSIS — I6502 Occlusion and stenosis of left vertebral artery: Secondary | ICD-10-CM

## 2019-11-17 DIAGNOSIS — D573 Sickle-cell trait: Secondary | ICD-10-CM

## 2019-11-17 DIAGNOSIS — I10 Essential (primary) hypertension: Secondary | ICD-10-CM | POA: Diagnosis not present

## 2019-11-17 DIAGNOSIS — J351 Hypertrophy of tonsils: Secondary | ICD-10-CM

## 2019-11-17 DIAGNOSIS — E1159 Type 2 diabetes mellitus with other circulatory complications: Secondary | ICD-10-CM | POA: Diagnosis not present

## 2019-11-17 DIAGNOSIS — I7774 Dissection of vertebral artery: Secondary | ICD-10-CM | POA: Diagnosis not present

## 2019-11-17 DIAGNOSIS — E119 Type 2 diabetes mellitus without complications: Secondary | ICD-10-CM

## 2019-11-17 LAB — RAPID URINE DRUG SCREEN, HOSP PERFORMED
Amphetamines: NOT DETECTED
Barbiturates: NOT DETECTED
Benzodiazepines: NOT DETECTED
Cocaine: NOT DETECTED
Opiates: NOT DETECTED
Tetrahydrocannabinol: NOT DETECTED

## 2019-11-17 LAB — BASIC METABOLIC PANEL
Anion gap: 9 (ref 5–15)
BUN: 9 mg/dL (ref 8–23)
CO2: 18 mmol/L — ABNORMAL LOW (ref 22–32)
Calcium: 7 mg/dL — ABNORMAL LOW (ref 8.9–10.3)
Chloride: 111 mmol/L (ref 98–111)
Creatinine, Ser: 0.6 mg/dL (ref 0.44–1.00)
GFR calc Af Amer: >60 mL/min (ref >60)
GFR calc non Af Amer: >60 mL/min (ref >60)
Glucose, Bld: 107 mg/dL — ABNORMAL HIGH (ref 70–99)
Potassium: 3.3 mmol/L — ABNORMAL LOW (ref 3.5–5.1)
Sodium: 138 mmol/L (ref 135–145)

## 2019-11-17 LAB — CBC
HCT: 41.8 % (ref 36.0–46.0)
Hemoglobin: 13.5 g/dL (ref 12.0–15.0)
MCH: 27.3 pg (ref 26.0–34.0)
MCHC: 32.3 g/dL (ref 30.0–36.0)
MCV: 84.4 fL (ref 80.0–100.0)
Platelets: 275 10*3/uL (ref 150–400)
RBC: 4.95 MIL/uL (ref 3.87–5.11)
RDW: 14.4 % (ref 11.5–15.5)
WBC: 8.6 10*3/uL (ref 4.0–10.5)
nRBC: 0 % (ref 0.0–0.2)

## 2019-11-17 LAB — ECHOCARDIOGRAM COMPLETE
Area-P 1/2: 2.76 cm2
Height: 68 in
S' Lateral: 2.2 cm
Weight: 3264 oz

## 2019-11-17 LAB — CBG MONITORING, ED
Glucose-Capillary: 108 mg/dL — ABNORMAL HIGH (ref 70–99)
Glucose-Capillary: 108 mg/dL — ABNORMAL HIGH (ref 70–99)
Glucose-Capillary: 130 mg/dL — ABNORMAL HIGH (ref 70–99)
Glucose-Capillary: 133 mg/dL — ABNORMAL HIGH (ref 70–99)
Glucose-Capillary: 164 mg/dL — ABNORMAL HIGH (ref 70–99)

## 2019-11-17 LAB — HIV ANTIBODY (ROUTINE TESTING W REFLEX): HIV Screen 4th Generation wRfx: NONREACTIVE

## 2019-11-17 LAB — FOLATE: Folate: 15.6 ng/mL (ref >5.9)

## 2019-11-17 LAB — LIPID PANEL
Cholesterol: 266 mg/dL — ABNORMAL HIGH (ref 0–200)
HDL: 63 mg/dL (ref >40)
LDL Cholesterol: 191 mg/dL — ABNORMAL HIGH (ref 0–99)
Total CHOL/HDL Ratio: 4.2 RATIO
Triglycerides: 61 mg/dL (ref <150)
VLDL: 12 mg/dL (ref 0–40)

## 2019-11-17 LAB — GLUCOSE, CAPILLARY: Glucose-Capillary: 179 mg/dL — ABNORMAL HIGH (ref 70–99)

## 2019-11-17 MED ORDER — BRIMONIDINE TARTRATE 0.2 % OP SOLN: 1.0000 [drp] | Freq: Three times a day (TID) | OPHTHALMIC | Status: DC

## 2019-11-17 MED ORDER — BRIMONIDINE TARTRATE 0.2 % OP SOLN
1.0000 [drp] | Freq: Two times a day (BID) | OPHTHALMIC | Status: DC
  Administered 2019-11-17: 1 [drp] via OPHTHALMIC
  Filled 2019-11-17 (×2): qty 5

## 2019-11-17 MED ORDER — LATANOPROST 0.005 % OP SOLN
1.0000 [drp] | Freq: Every day | OPHTHALMIC | Status: DC
  Administered 2019-11-17: 1 [drp] via OPHTHALMIC
  Filled 2019-11-17: qty 2.5

## 2019-11-17 MED ORDER — BRIMONIDINE TARTRATE 0.15 % OP SOLN
1.0000 [drp] | Freq: Two times a day (BID) | OPHTHALMIC | Status: DC
  Administered 2019-11-17 – 2019-11-18 (×2): 1 [drp] via OPHTHALMIC
  Filled 2019-11-17: qty 5

## 2019-11-17 MED ORDER — BRIMONIDINE TARTRATE-TIMOLOL 0.2-0.5 % OP SOLN
1.0000 [drp] | Freq: Two times a day (BID) | OPHTHALMIC | Status: DC
  Filled 2019-11-17: qty 5

## 2019-11-17 MED ORDER — VITAMIN D 25 MCG (1000 UNIT) PO TABS
5000.0000 [IU] | ORAL_TABLET | Freq: Every day | ORAL | Status: DC
  Administered 2019-11-17 – 2019-11-18 (×2): 5000 [IU] via ORAL
  Filled 2019-11-17 (×2): qty 5

## 2019-11-17 MED ORDER — CLOPIDOGREL BISULFATE 75 MG PO TABS
75.0000 mg | ORAL_TABLET | Freq: Every day | ORAL | Status: DC
  Administered 2019-11-17 – 2019-11-18 (×2): 75 mg via ORAL
  Filled 2019-11-17 (×2): qty 1

## 2019-11-17 MED ORDER — TIMOLOL MALEATE 0.5 % OP SOLN
1.0000 [drp] | Freq: Two times a day (BID) | OPHTHALMIC | Status: DC
  Administered 2019-11-17 – 2019-11-18 (×3): 1 [drp] via OPHTHALMIC
  Filled 2019-11-17 (×2): qty 5

## 2019-11-17 MED ORDER — ATORVASTATIN CALCIUM 80 MG PO TABS
80.0000 mg | ORAL_TABLET | Freq: Every day | ORAL | Status: DC
  Administered 2019-11-17 – 2019-11-18 (×2): 80 mg via ORAL
  Filled 2019-11-17 (×2): qty 1

## 2019-11-17 NOTE — Consult Note (Addendum)
Requesting Physician: Dr. Evette Doffing    Chief Complaint: Dizziness and heart palpitations, intermittent episodes of vertigo  History obtained from: Patient and Chart   HPI:                                                                                                                                       Suzanne Baldwin is a 64 y.o. female with past medical history significant for diabetes mellitus, hyperlipidemia, hypertension, sickle cell trait, breast cancer in remission presents to emergency department with intermittent episodes of vertigo for 2 weeks followed by generalized weakness, nausea and palpitations.  She states that she went to [facility] 2 weeks ago for her friends wedding and she had an episode of dizziness and near syncope but was able to catch onto the counter. This lasted for a few seconds and completely resolved she denies falling or any neck injury. She states that since then she has had a few more episodes of dizziness characterized with room spinning with associated falls. She has been feeling increasingly fatigued since Thursday and starting Friday feeling a little off balance. She also has noticed her heart has been beating faster and feeling jittery and checked her blood pressure is elevated 165 x 100. She called her PCPs office today who recommended her to come to the emergency department.  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 No other evidence of atherosclerosis raising suspicion for dissection/  She denies any neck trauma, recent massages or neck manipulation, lifting heavy weights. Patient states her neck pain is chronic due to arthritis.  She denies any history of facial droop, slurred speech weakness in arm or leg.   Date last known well: Unclear, possibly 2 weeks ago tPA  Given: No, outside window NIHSS: 0 Baseline MRS 0   Past Medical History:  Diagnosis Date  . Allergy   . Arthritis   . Breast disorder    breast cancer 25 years ago  . Cancer ([place])   . Clotting disorder ([place])   . Diabetes mellitus without complication ([place])   . Glaucoma   . Hyperlipidemia   . Hypertension   . Sickle cell trait [facility])     Past Surgical History:  Procedure Laterality Date  . CESAREAN SECTION    . COLONOSCOPY    . COSMETIC SURGERY    . FRACTURE SURGERY    . POLYPECTOMY    . ROTATOR CUFF REPAIR    . TUBAL LIGATION      Family History  Problem Relation Age of Onset  . Diabetes Father   . Hypertension Father   . Stroke Father   . Diabetes Mother   . Hypertension Mother   . Breast cancer Mother   .  Cancer Mother   . Heart disease Maternal Grandmother   . Colon cancer Neg Hx   . Esophageal cancer Neg Hx   . Stomach cancer Neg Hx   . Rectal cancer Neg Hx    Social History:  reports that she has never smoked. She has never used smokeless tobacco. She reports that she does not drink alcohol and does not use drugs.  Allergies: No Known Allergies  Medications:                                                                                                                        I reviewed home medications   ROS:                                                                                                                                     14 systems reviewed and negative except above    Examination:                                                                                                      General: Appears well-developed . Psych: Affect appropriate to situation Eyes: No scleral injection HENT: No OP obstrucion Head: Normocephalic.  Cardiovascular: Normal rate and regular rhythm. Respiratory: Effort normal and breath sounds normal to anterior ascultation GI: Soft.  No distension. There is no tenderness.  Skin: WDI    Neurological  Examination Mental Status: Alert, oriented, thought content appropriate.  Speech fluent without evidence of aphasia. Able to follow 3 step commands without difficulty. Cranial Nerves: II: Visual fields grossly normal,  III,IV, VI: ptosis not present, extra-ocular motions intact bilaterally, pupils equal, round, reactive to light and accommodation V,VII: smile symmetric, facial light touch sensation normal bilaterally VIII: hearing normal bilaterally IX,X: uvula rises symmetrically XI: bilateral shoulder shrug XII: midline tongue extension Motor: Right : Upper extremity   5/5    Left:     Upper extremity   5/5  Lower extremity   5/5  Lower extremity   5/5 Tone and bulk:normal tone throughout; no atrophy noted Sensory: Pinprick and light touch intact throughout, bilaterally Deep Tendon Reflexes: 2+ and symmetric throughout Plantars: Right: downgoing   Left: downgoing Cerebellar: normal finger-to-nose, normal rapid alternating movements and normal heel-to-shin test Gait: Not assessed     Lab Results: Basic Metabolic Panel: Recent Labs  Lab 11/16/19 1435  NA 138  K 4.4  CL 103  CO2 27  GLUCOSE 110*  BUN 9  CREATININE 0.80  CALCIUM 9.6    CBC: Recent Labs  Lab 11/16/19 1435  WBC 8.1  HGB 15.0  HCT 46.6*  MCV 83.7  PLT 374    Coagulation Studies: No results for input(s): LABPROT, INR in the last 72 hours.  Imaging: CT Angio Head W or Wo Contrast  Addendum Date: 11/16/2019   ADDENDUM REPORT: 11/16/2019 18:40 ADDENDUM: Study discussed by telephone with Dr. Hart Robinsons in the ED on 11/16/2019 at 1835 hours. Electronically Signed   By: Genevie Ann M.D.   On: 11/16/2019 18:40   Result Date: 11/16/2019 CLINICAL DATA:  64 year old female with headache, nausea, dizziness and hypertension. EXAM: CT ANGIOGRAPHY HEAD AND NECK TECHNIQUE: Multidetector CT imaging of the head and neck was performed using the standard protocol during bolus administration of intravenous contrast.  Multiplanar CT image reconstructions and MIPs were obtained to evaluate the vascular anatomy. Carotid stenosis measurements (when applicable) are obtained utilizing NASCET criteria, using the distal internal carotid diameter as the denominator. CONTRAST:  65mL OMNIPAQUE IOHEXOL 350 MG/ML SOLN COMPARISON:  None. FINDINGS: CT HEAD Brain: Normal cerebral volume. No midline shift, ventriculomegaly, mass effect, evidence of mass lesion, intracranial hemorrhage or evidence of cortically based acute infarction. Patchy bilateral cerebral white matter hypodensity, most pronounced in the periatrial regions. Elsewhere gray-white matter differentiation is within normal limits. Mild dystrophic calcifications in the bilateral basal ganglia. No cortical encephalomalacia identified. Calvarium and skull base: Negative. Paranasal sinuses: Visualized paranasal sinuses and mastoids are clear. Orbits: Visualized orbits and scalp soft tissues are within normal limits. CTA NECK Skeleton: Absent maxillary dentition. Levoconvex cervicothoracic scoliosis. No acute osseous abnormality identified. Upper chest: Negative. Other neck: There is no cervical lymphadenopathy. But there is asymmetric enlargement of the left palatine tonsil (series 12, image 99). Superimposed postinflammatory calcifications are noted in that tonsil. No acute inflammation in the deep soft tissue spaces of the neck. Aortic arch: 3 vessel arch configuration.  No arch atherosclerosis. Right carotid system: Negative. Left carotid system: Negative aside from tortuosity. Vertebral arteries: The proximal right subclavian artery and right vertebral arteries are normal. Proximal left subclavian artery is normal but the left vertebral artery is occluded at its origin (series 12, image 148 and series 11, image 2 fifty-eight. There is no enhancement of the left V1 segment. There is only thread-like V2 enhancement at C2-C3 which appears related to supply from a muscular branch.  Largely absent V3 enhancement. CTA HEAD Posterior circulation: The right vertebral V4 segment is normal to the basilar. A small left V4 segment is reconstituted, including the left PICA. There is no distal vertebral stenosis. The right PICA origin is normal. Patent basilar artery without stenosis. Normal SCA and right PCA origins. Fetal type left PCA origin. Right posterior communicating artery diminutive or absent. Bilateral PCA branches are within normal limits. Anterior circulation: The left ICA siphon appears dominant and is mildly dolichoectatic. Both ICA siphons are tortuous and patent with no plaque or stenosis. Normal ophthalmic and left posterior communicating artery origins. Patent carotid termini.  The left ACA A1 segment is dominant, the right diminutive or absent. Anterior communicating artery and bilateral ACA branches are within normal limits. Normal MCA origins. The left MCA bifurcates early without stenosis. Right MCA M1 and bifurcation are patent without stenosis. Bilateral MCA branches are within normal limits. Venous sinuses: Patent. Anatomic variants: Fetal type left PCA origin. Dominant left and diminutive or absent right ACA A1 segments. Review of the MIP images confirms the above findings IMPRESSION: 1. 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 The Left V4 segment and PICA are reconstituted intracranially. 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 2. No acute intracranial abnormality by CT. Bilateral cerebral white matter changes compatible with small vessel disease. 3. Generalized arterial tortuosity elsewhere. No plaque or stenosis in the head or neck. 4. Asymmetric enlargement of the left palatine tonsil. No associated cervical lymphadenopathy. Recommend outpatient ENT follow-up. Electronically Signed: By: Genevie Ann M.D. On: 11/16/2019 18:30   CT Angio Neck W and/or Wo  Contrast  Addendum Date: 11/16/2019   ADDENDUM REPORT: 11/16/2019 18:40 ADDENDUM: Study discussed by telephone with Dr. Hart Robinsons in the ED on 11/16/2019 at 1835 hours. Electronically Signed   By: Genevie Ann M.D.   On: 11/16/2019 18:40   Result Date: 11/16/2019 CLINICAL DATA:  64 year old female with headache, nausea, dizziness and hypertension. EXAM: CT ANGIOGRAPHY HEAD AND NECK TECHNIQUE: Multidetector CT imaging of the head and neck was performed using the standard protocol during bolus administration of intravenous contrast. Multiplanar CT image reconstructions and MIPs were obtained to evaluate the vascular anatomy. Carotid stenosis measurements (when applicable) are obtained utilizing NASCET criteria, using the distal internal carotid diameter as the denominator. CONTRAST:  25mL OMNIPAQUE IOHEXOL 350 MG/ML SOLN COMPARISON:  None. FINDINGS: CT HEAD Brain: Normal cerebral volume. No midline shift, ventriculomegaly, mass effect, evidence of mass lesion, intracranial hemorrhage or evidence of cortically based acute infarction. Patchy bilateral cerebral white matter hypodensity, most pronounced in the periatrial regions. Elsewhere gray-white matter differentiation is within normal limits. Mild dystrophic calcifications in the bilateral basal ganglia. No cortical encephalomalacia identified. Calvarium and skull base: Negative. Paranasal sinuses: Visualized paranasal sinuses and mastoids are clear. Orbits: Visualized orbits and scalp soft tissues are within normal limits. CTA NECK Skeleton: Absent maxillary dentition. Levoconvex cervicothoracic scoliosis. No acute osseous abnormality identified. Upper chest: Negative. Other neck: There is no cervical lymphadenopathy. But there is asymmetric enlargement of the left palatine tonsil (series 12, image 99). Superimposed postinflammatory calcifications are noted in that tonsil. No acute inflammation in the deep soft tissue spaces of the neck. Aortic arch: 3 vessel arch  configuration.  No arch atherosclerosis. Right carotid system: Negative. Left carotid system: Negative aside from tortuosity. Vertebral arteries: The proximal right subclavian artery and right vertebral arteries are normal. Proximal left subclavian artery is normal but the left vertebral artery is occluded at its origin (series 12, image 148 and series 11, image 2 fifty-eight. There is no enhancement of the left V1 segment. There is only thread-like V2 enhancement at C2-C3 which appears related to supply from a muscular branch. Largely absent V3 enhancement. CTA HEAD Posterior circulation: The right vertebral V4 segment is normal to the basilar. A small left V4 segment is reconstituted, including the left PICA. There is no distal vertebral stenosis. The right PICA origin is normal. Patent basilar artery without stenosis. Normal SCA and right PCA origins. Fetal type left PCA origin. Right posterior communicating artery  diminutive or absent. Bilateral PCA branches are within normal limits. Anterior circulation: The left ICA siphon appears dominant and is mildly dolichoectatic. Both ICA siphons are tortuous and patent with no plaque or stenosis. Normal ophthalmic and left posterior communicating artery origins. Patent carotid termini. The left ACA A1 segment is dominant, the right diminutive or absent. Anterior communicating artery and bilateral ACA branches are within normal limits. Normal MCA origins. The left MCA bifurcates early without stenosis. Right MCA M1 and bifurcation are patent without stenosis. Bilateral MCA branches are within normal limits. Venous sinuses: Patent. Anatomic variants: Fetal type left PCA origin. Dominant left and diminutive or absent right ACA A1 segments. Review of the MIP images confirms the above findings IMPRESSION: 1. 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 The Left V4 segment and PICA are reconstituted intracranially. Given the  absence of atherosclerosis elsewhere, a left vertebral artery dissection is favored. 2. No acute intracranial abnormality by CT. Bilateral cerebral white matter changes compatible with small vessel disease. 3. Generalized arterial tortuosity elsewhere. No plaque or stenosis in the head or neck. 4. Asymmetric enlargement of the left palatine tonsil. No associated cervical lymphadenopathy. Recommend outpatient ENT follow-up. Electronically Signed: By: Genevie Ann M.D. On: 11/16/2019 18:30     ASSESSMENT AND PLAN   64 y.o. female with past medical history significant for diabetes mellitus, hyperlipidemia, hypertension, sickle cell trait, breast cancer in remission presents to emergency department with intermittent episodes of vertigo for 2 weeks followed by generalized weakness, nausea and palpitations. Her symptoms are fairly nonspecific, however could represent a posterior circulation stroke, however also could be due to hypertensive emergency. The right vertebral artery is definitely occluded at its origin-and while patient does have significant atherosclerotic risk factors, I do not see much calcification/stenosis in remaining vessels making dissection highly probable but certainly not definite. There is no intraluminal thrombus and hence no need for anticoagulation. We recommend treatment with antiplatelet therapy.  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 Aspirin 81 mg daily Atorvastatin 40 mg daily HB A1c lipid profile Swallow eval PT OT       Daishaun Ayre [facility] Pager Number [phone]

## 2019-11-17 NOTE — Progress Notes (Signed)
STROKE TEAM PROGRESS NOTE   INTERVAL HISTORY No family at the bedside.  Patient sitting in chair, recounted HPI with me.  Patient stated that in 2010, he had right ankle fracture, status post surgery, 2 days later, she had DVT on the right lower extremity.  She was put on Coumadin for 6 months.  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  Since then, she has no clotting events.  This time, she had intermittent dizziness when she was at [facility] last week.  For the last 2 days, she felt unsteady, had a fall 1 time which triggered her come to ER for evaluation.  MRI showed no stroke however, CT head and neck showed left VA occlusion.  OBJECTIVE Vitals:   11/17/19 1904 11/18/19 0000 11/18/19 0400 11/18/19 0824  BP: (!) 119/100 115/72 112/78 134/78  Pulse: 77 (!) 59 66 60  Resp: 18 19 18 20   Temp: 98.4 F (36.9 C) 97.7 F (36.5 C) 97.7 F (36.5 C) 97.8 F (36.6 C)  TempSrc: Oral Oral Oral Oral  SpO2: 100% 99% 100% 100%  Weight:      Height:       CBC:  Recent Labs  Lab 11/16/19 1435 11/17/19 0441  WBC 8.1 8.6  HGB 15.0 13.5  HCT 46.6* 41.8  MCV 83.7 84.4  PLT 374 388   Basic Metabolic Panel:  Recent Labs  Lab 11/16/19 1435 11/17/19 0441  NA 138 138  K 4.4 3.3*  CL 103 111  CO2 27 18*  GLUCOSE 110* 107*  BUN 9 9  CREATININE 0.80 0.60  CALCIUM 9.6 7.0*   Lipid Panel:     Component Value Date/Time   CHOL 266 (H) 11/16/2019 2134   TRIG 61 11/16/2019 2134   HDL 63 11/16/2019 2134   CHOLHDL 4.2 11/16/2019 2134   VLDL 12 11/16/2019 2134   LDLCALC 191 (H) 11/16/2019 2134   HgbA1c:  Lab Results  Component Value Date   HGBA1C 6.6 (H) 11/16/2019   Urine Drug Screen:     Component Value Date/Time   LABOPIA NONE DETECTED 11/17/2019 1144   COCAINSCRNUR NONE DETECTED 11/17/2019 1144   LABBENZ NONE DETECTED 11/17/2019 1144   AMPHETMU NONE DETECTED 11/17/2019 1144   THCU NONE DETECTED 11/17/2019 1144   LABBARB NONE DETECTED 11/17/2019 1144     Alcohol Level No results found for: ETH  IMAGING  CT Angio Head W or Wo Contrast CT Angio Neck W and/or Wo Contrast 11/16/2019 IMPRESSION:  1. 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 The Left V4 segment and PICA are reconstituted intracranially. 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  2. No acute intracranial abnormality by CT. Bilateral cerebral white matter changes compatible with small vessel disease.  3. Generalized arterial tortuosity elsewhere. No plaque or stenosis in the head or neck.  4. Asymmetric enlargement of the left palatine tonsil. No associated cervical lymphadenopathy. Recommend outpatient ENT follow-up.   MR BRAIN WO CONTRAST 11/17/2019 IMPRESSION:  1. No acute intracranial abnormality.  2. Multifocal hyperintense T2-weighted signal within the white matter, most commonly chronic ischemic microangiopathy.   ECHOCARDIOGRAM COMPLETE 11/17/2019 1. Left ventricular ejection fraction, by estimation, is 60 to 65%. The left ventricle has normal function. The left ventricle has no regional wall motion abnormalities. There is moderate concentric left ventricular hypertrophy. Left ventricular diastolic parameters are consistent with Grade I diastolic dysfunction (impaired relaxation).   2. Right  ventricular systolic function is normal. The right ventricular size is normal. Tricuspid regurgitation signal is inadequate for assessing PA pressure.   3. The mitral valve is normal in structure. Trivial mitral valve regurgitation. No evidence of mitral stenosis.   4. The aortic valve is normal in structure. Aortic valve regurgitation is not visualized. No aortic stenosis is present.   5. The inferior vena cava is normal in size with greater than 50% respiratory variability, suggesting right atrial pressure of 3 mmHg.   ECG - SR rate 74 BPM. (See cardiology reading for complete  details)  PHYSICAL EXAM  Temp:  [97.7 F (36.5 C)-98.4 F (36.9 C)] 98.2 F (36.8 C) (09/20 1558) Pulse Rate:  [59-77] 65 (09/20 1558) Resp:  [18-20] 18 (09/20 1558) BP: (112-134)/(64-100) 115/64 (09/20 1558) SpO2:  [99 %-100 %] 100 % (09/20 1558)  General - Well nourished, well developed, in no apparent distress.  Ophthalmologic - fundi not visualized due to noncooperation.  Cardiovascular - Regular rhythm and rate.  Mental Status -  Level of arousal and orientation to time, place, and person were intact. Language including expression, naming, repetition, comprehension was assessed and found intact. Fund of Knowledge was assessed and was intact.  Cranial Nerves II - XII - II - Visual field intact OU. III, IV, VI - Extraocular movements intact. V - Facial sensation intact bilaterally. VII - Facial movement intact bilaterally. VIII - Hearing & vestibular intact bilaterally. X - Palate elevates symmetrically. XI - Chin turning & shoulder shrug intact bilaterally. XII - Tongue protrusion intact.  Motor Strength - The patient's strength was normal in all extremities and pronator drift was absent.  Bulk was normal and fasciculations were absent.   Motor Tone - Muscle tone was assessed at the neck and appendages and was normal.  Reflexes - The patient's reflexes were symmetrical in all extremities and she had no pathological reflexes.  Sensory - Light touch, temperature/pinprick were assessed and were symmetrical.    Coordination - The patient had normal movements in the hands and feet with no ataxia or dysmetria.  Tremor was absent.  Gait and Station - deferred.   ASSESSMENT/PLAN Ms. Zanaiya Calabria is a 64 y.o. female with history of diabetes mellitus, hyperlipidemia, hypertension, sickle cell trait, hx of a clotting disorder, and breast cancer in remission presenting with intermittent episodes of vertigo for 2 weeks followed by generalized weakness, nausea, increasing  fatigue, tachycardia, palpitations, and a left-sided throbbing headache with neck pain. A CTA revealed an occluded left vertebral artery at the origin. There was no other evidence of atherosclerosis raising suspicion for dissection.   TIA due to Left vertebral ? occlusion ? Dissection - need to rule out occult afib or clotting disorder  MRI head - No acute intracranial abnormality. Multifocal hyperintense T2-weighted signal within the white matter, most commonly chronic ischemic microangiopathy.   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 The Left V4 segment and PICA are reconstituted intracranially.   2D Echo - EF 60-65%. No source of embolus   LE dopplers no DVT  Lacey Jensen Virus 2 - negative  UDS neg  LDL - 191  HgbA1c - 6.6  VTE prophylaxis - SCDs  No antithrombotic prior to admission, now on aspirin 81 mg daily and plavix DAPT for 3 months and then ASA alone  Patient counseled to be compliant with her antithrombotic medications  Ongoing aggressive stroke risk factor management  Therapy recommendations:  HH PT  Disposition:  Pending  Left vertebral A. Occlusion  No atherosclerosis elsewhere, raising concern for dissection vs. cardioembolic  Pt denies head trauma  Hx of protein S deficiency - repeat pending  Given heart palpitation PTA, will recommend 30 day cardiac event monitoring as outpt to rule out afib  Hx of LE DVT s/p fracture  11/2008 right LE DVT s/p right ankle fracture  At that time protein S activity low at 37, total protein S 107 normal - lupus anticoagulant elevated on PTT and DRVVT, B2-glycoprotein Ab IgA 24  Hypercoagulable lab Repeat pending  Heart palpitation  Happened once before admission  will recommend 30 day cardiac event monitoring as outpt to rule out afib  ? 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 Long-term BP goal normotensive  Hyperlipidemia  Home Lipid lowering medication: Lipitor 10 mg daily   LDL 191, goal < 70  Current lipid lowering medication: Lipitor 80 mg daily   Continue statin at discharge  Diabetes  Home diabetic meds: none   Current diabetic meds: SSI   HgbA1c 6.6, goal < 7.0  CBG monitoring  Other Stroke Risk Factors  Advanced age  Obesity, Body mass index is 31.02 kg/m., recommend weight loss, diet and exercise as appropriate   Family hx stroke (father)    Other Active Problems  Code status - Full code   Asymmetric enlargement of the left palatine tonsil. No associated cervical lymphadenopathy. Recommend outpatient ENT follow-up.   Hypokalemia - potassium - 3.3 - supplement    Hospital day # 2  Neurology will sign off. Please call with questions. Pt will follow up with stroke clinic Dr. Leonie Man at [facility] in about 4 weeks. Thanks for the consult.  Rosalin Hawking, MD PhD Stroke Neurology 11/18/2019 6:23 PM   To contact Stroke Continuity provider, please refer to [email or web address] After hours, contact General Neurology

## 2019-11-17 NOTE — Progress Notes (Signed)
   Subjective:   Patient states her dizziness resolved and she feels "back to normal." Discussed with patient the dissection on her imaging and diagnosis of TIA. She was not taking baby ASA or cholesterol medication at home. Was previously on atorvastatin, but was stopped when her cholesterol improved. Denies going to any chiropractors, but notes she started pilates 1 month ago and had difficulties with movements of her neck and upper back. She has been going to beginner classes at a pilates studio. No sudden jerky exercises. She is very motivated to make lifestyle changes, notes significant FHx of strokes.  Objective:  Vital signs in last 24 hours: Vitals:   11/17/19 0100 11/17/19 0115 11/17/19 0130 11/17/19 0300  BP: 124/76 117/79 117/74 137/78  Pulse: 64 63 68 81  Resp: 20 18 19  (!) 23  Temp:      TempSrc:      SpO2: 98% 97% 97% 98%  Weight:      Height:       Constitutional: Well-developed and well-nourished. No acute distress.  Head: Normocephalic and atraumatic.  Cardiovascular:  RRR, nl S1S2, no murmur,  no LEE Respiratory: Effort normal and breath sounds normal. No respiratory distress. No wheezes.  GI: Soft. Bowel sounds are normal. No distension. There is no tenderness.  Neurological: Is alert and oriented x 3  Skin: Not diaphoretic. No erythema.  Psychiatric:  Normal mood and affect. Behavior is normal. Judgment and thought content normal.   Assessment/Plan:  Active Problems:   Vertebral artery dissection ([place])  64 y/o female with HTN, diabetes (hemoglobin A1c borderline at 6.6.  Not on medication at home), sickle cell trait, who presented with dizziness and found to have L. Vertebral artery occlusion, concerning for left vertebral dissection.  TIA 2/2 left vertebral art. occlusion likley 2/2 spontaneous L. Vertebral artery dissection.  Ct head and brain MRI w/o evidence of hemorrhagic or ischemic stroke. Head and neck MRA showed: acute occlusion of left vertebral  artery.  No thrombosis or atherosclerosis. Therefore, the finding was suspicious to be due to vertebral artery dissection. Neurology has been on board and recommended medical management.   -Symptoms resolved.  No further dizziness -Continue aspirin and Lipitor 80 mg daily -No intervention for probable vertebral artery dissection unless she develops recurrent symptoms.  Appreciate stroke team recommendations -Echo pending  HTN: Was normotensive this a.m. with blood pressure~120s-130s/70s-80s.  Amlodipine was held overnight.  BP goal likely be normotensive.  We will probably resume amlodipine if remain more than goal.  Appreciate stroke team recommendation regarding BP goal.  DM:  Hemoglobin A1c here was 6.6 just above prediabetes range.  Metformin at discharge and to follow-up with PCP.  Asymmetric enlargement of the left palatine tonsil:  -Follow-up HIV test -Outpatient follow-up with ENT   Prior to Admission Living Arrangement: Home Anticipated Discharge Location: Home Barriers to Discharge: TIA work-up : echocardiogram, neurology evaluation Dispo: Anticipated discharge in approximately 0-1 day  Andrew Au, MD 11/17/2019, 7:58 AM Pager: [phone] After 5pm on weekdays and 1pm on weekends: On Call pager [phone]

## 2019-11-17 NOTE — Progress Notes (Signed)
SLP Cancellation Note  Patient Details Name: Suzanne Baldwin MRN: [id number] DOB: 07/14/1955   Cancelled treatment:       Reason Eval/Treat Not Completed: Other (comment) (Per RN, patient passed [place]. No SLP needs indicated. )   Jamen Loiseau [name] 11/17/2019, 10:50 AM

## 2019-11-17 NOTE — Progress Notes (Signed)
*  PRELIMINARY RESULTS* Echocardiogram 2D Echocardiogram has been performed.  Leavy Cella 11/17/2019, 2:52 PM

## 2019-11-17 NOTE — Plan of Care (Signed)
Patient was seen earlier by Dr. Lorraine Lax.  In summary, 64 year old female with history of diabetes, hypertension, hyperlipidemia, breast cancer in remission presented to ER for dizziness, vertigo with fall x2 weeks.  Also complained of palpitation and nausea and imbalance, headache and neck pain.  CT no acute abnormality.  However, CTA head and neck showed left vertebral artery occlusion, questionable for dissection.  MRI negative.  2D echo pending.  A1c 6.6, LDL 191 and UDS negative.  TSH normal.  Patient symptoms concerning for posterior circulation ischemia, however her symptoms also somehow nonspecific.  MRI negative.  Not sure the left vertebral artery occlusion is chronic versus acute versus hypoplastic.  However, patient did have protein S deficiency in a hypercoagulable lab in 11/2008.  We will repeat hypercoagulable work-up, as well as other stroke work-up including RPR, B12, folate acid, ANA.  Patient LDL was high, put on Lipitor 80.  For current symptoms, concerning for TIA, put on aspirin 81 and Plavix DAPT.  Stroke team will follow.  Rosalin Hawking, MD PhD Stroke Neurology 11/17/2019 5:16 PM

## 2019-11-18 ENCOUNTER — Inpatient Hospital Stay (HOSPITAL_COMMUNITY): Payer: Medicare Other

## 2019-11-18 ENCOUNTER — Other Ambulatory Visit: Payer: Self-pay | Admitting: Cardiology

## 2019-11-18 DIAGNOSIS — D6859 Other primary thrombophilia: Secondary | ICD-10-CM

## 2019-11-18 DIAGNOSIS — E1159 Type 2 diabetes mellitus with other circulatory complications: Secondary | ICD-10-CM

## 2019-11-18 DIAGNOSIS — G459 Transient cerebral ischemic attack, unspecified: Secondary | ICD-10-CM

## 2019-11-18 DIAGNOSIS — Z86718 Personal history of other venous thrombosis and embolism: Secondary | ICD-10-CM

## 2019-11-18 DIAGNOSIS — I639 Cerebral infarction, unspecified: Secondary | ICD-10-CM

## 2019-11-18 DIAGNOSIS — I7774 Dissection of vertebral artery: Principal | ICD-10-CM

## 2019-11-18 DIAGNOSIS — E7800 Pure hypercholesterolemia, unspecified: Secondary | ICD-10-CM

## 2019-11-18 LAB — COMPREHENSIVE METABOLIC PANEL
ALT: 12 U/L (ref 0–44)
AST: 17 U/L (ref 15–41)
Albumin: 3.7 g/dL (ref 3.5–5.0)
Alkaline Phosphatase: 88 U/L (ref 38–126)
Anion gap: 9 (ref 5–15)
BUN: 14 mg/dL (ref 8–23)
CO2: 27 mmol/L (ref 22–32)
Calcium: 9.5 mg/dL (ref 8.9–10.3)
Chloride: 102 mmol/L (ref 98–111)
Creatinine, Ser: 1.05 mg/dL — ABNORMAL HIGH (ref 0.44–1.00)
GFR calc Af Amer: >60 mL/min (ref >60)
GFR calc non Af Amer: 56 mL/min — ABNORMAL LOW (ref >60)
Glucose, Bld: 138 mg/dL — ABNORMAL HIGH (ref 70–99)
Potassium: 4.1 mmol/L (ref 3.5–5.1)
Sodium: 138 mmol/L (ref 135–145)
Total Bilirubin: 0.4 mg/dL (ref 0.3–1.2)
Total Protein: 8.3 g/dL — ABNORMAL HIGH (ref 6.5–8.1)

## 2019-11-18 LAB — VITAMIN B12: Vitamin B-12: 313 pg/mL (ref 180–914)

## 2019-11-18 LAB — GLUCOSE, CAPILLARY
Glucose-Capillary: 103 mg/dL — ABNORMAL HIGH (ref 70–99)
Glucose-Capillary: 106 mg/dL — ABNORMAL HIGH (ref 70–99)
Glucose-Capillary: 106 mg/dL — ABNORMAL HIGH (ref 70–99)
Glucose-Capillary: 106 mg/dL — ABNORMAL HIGH (ref 70–99)
Glucose-Capillary: 111 mg/dL — ABNORMAL HIGH (ref 70–99)
Glucose-Capillary: 136 mg/dL — ABNORMAL HIGH (ref 70–99)

## 2019-11-18 LAB — ANTITHROMBIN III: AntiThromb III Func: 107 % (ref 75–120)

## 2019-11-18 LAB — RPR: RPR Ser Ql: NONREACTIVE

## 2019-11-18 MED ORDER — CLOPIDOGREL BISULFATE 75 MG PO TABS
75.0000 mg | ORAL_TABLET | Freq: Every day | ORAL | 0 refills | Status: DC
Start: 2019-11-19 — End: 2020-04-09

## 2019-11-18 MED ORDER — ASPIRIN 81 MG PO TBEC: 81.0000 mg | DELAYED_RELEASE_TABLET | Freq: Every day | ORAL | 11 refills | Status: AC

## 2019-11-18 MED ORDER — ATORVASTATIN CALCIUM 80 MG PO TABS: 80.0000 mg | ORAL_TABLET | Freq: Every day | ORAL | 0 refills | Status: DC

## 2019-11-18 MED ORDER — ONDANSETRON HCL 4 MG PO TABS: 4.0000 mg | ORAL_TABLET | Freq: Once | ORAL | Status: DC | PRN

## 2019-11-18 NOTE — TOC Initial Note (Signed)
Transition of Care [facility]) - Initial/Assessment Note    Patient Details  Name: Suzanne Baldwin MRN: [id number] Date of Birth: [birth year]  Transition [facility]) CM/SW Contact:    Pollie Friar, RN Phone Number: 11/18/2019, 4:09 PM  Clinical Narrative:                 Pt lives at home with her daughter.  DME: shower seat Pt denies any transportation issues. She does her own medications at home. Pt with orders for [facility] services. CM provided choice and [place] decided on. Butch Penny with [facility] accepted the referral.  Pt has transportation home when medically ready.   Expected Discharge Plan: [place] Barriers to Discharge: Continued Medical Work up   Patient Goals and CMS Choice   CMS Medicare.gov Compare Post Acute Care list provided to:: Patient Choice offered to / list presented to : Patient  Expected Discharge Plan and Services Expected Discharge Plan: [place]   Discharge Planning Services: CM Consult Post Acute Care Choice: [place] arrangements for the past 2 months: [place] Arranged: PT, OT [place] Agency: [place] ([place]) Date [place]: 11/18/19   Representative spoke with at [place]: Butch Penny  Prior Living Arrangements/Services Living arrangements for the past 2 months: [place] Lives with:: Adult Children Patient language and need for interpreter reviewed:: Yes Do you feel safe going back to the place where you live?: Yes      Need for Family Participation in Patient Care: Yes (Comment) Care giver support system in place?: Yes (comment) (daughter works from home)   Criminal Activity/Legal Involvement Pertinent to Current Situation/Hospitalization: No - Comment as needed  Activities of Daily Living      Permission Sought/Granted                  Emotional Assessment Appearance:: Appears stated age Attitude/Demeanor/Rapport:  Engaged Affect (typically observed): Accepting Orientation: : Oriented to Self, Oriented to Place, Oriented to  Time, Oriented to Situation   Psych Involvement: No (comment)  Admission diagnosis:  Vertebral artery dissection [facility]) [I77.74] Patient Active Problem List   Diagnosis Date Noted  . TIA (transient ischemic attack) 11/17/2019  . Diabetes ([place]) 11/17/2019  . Vertebral artery dissection ([place]) 11/16/2019  . NAUSEA 03/25/2010  . PREHYPERTENSION 03/25/2010  . POSTMENOPAUSAL STATUS 11/20/2009  . FOOT PAIN, LEFT 09/23/2009  . OBESITY 08/04/2009  . DEEP VENOUS THROMBOPHLEBITIS, LEG, RIGHT 12/23/2008  . FRACTURE, ANKLE, RIGHT 10/29/2008  . TRICHOMONIASIS 03/06/2007  . HYPERCHOLESTEROLEMIA 03/06/2007  . DERMATOPHYTOSIS, NAIL 02/01/2007  . ALLERGIC RHINITIS 02/01/2007  . NEOPLASM, MALIGNANT, BREAST, HX OF 02/01/2007   PCP:  Bartholome Bill, MD Pharmacy:   [id number] - [address], [place] [address] Phone: [phone] Fax: [phone]     Social Determinants of Health (SDOH) Interventions    Readmission Risk Interventions No flowsheet data found.

## 2019-11-18 NOTE — Progress Notes (Signed)
Occupational Therapy Treatment Patient Details Name: Suzanne Baldwin MRN: [id number] DOB: 1955-12-11 Today's Date: 11/18/2019    History of present illness 64 yo female presented 11/16/19 with dizziness vertigo (intermittent for 2 weeks) s/p falls. workup for TIA 2/2 L vertebral artery dissection  PMH DM HTN HLD breast CA    OT comments  Pt demonstrates vestibular changes affecting all adls. Pt with good recall of education and able to apply education to adl functional task. Pt with good return demo of gaze stabilization. Pt fearful of nauseated dizziness and very much avoiding task that cause sensation. Pt will benefit from vestibular therapist that can help challenge vestibular system at outpatient after home safety assessments.    Follow Up Recommendations  Home health OT    Equipment Recommendations  None recommended by OT    Recommendations for Other Services      Precautions / Restrictions Precautions Precautions: Fall Precaution Comments: +vertigo       Mobility Bed Mobility               General bed mobility comments: up in chair  Transfers Overall transfer level: Independent Equipment used: None                  Balance Overall balance assessment: Needs assistance Sitting-balance support: No upper extremity supported;Feet supported Sitting balance-Leahy Scale: Good Sitting balance - Comments: shifting outside BOS without difficulty   Standing balance support: No upper extremity supported Standing balance-Leahy Scale: Fair             [place] - Eyes Closed:  (immediate posterior LOB)               ADL either performed or assessed with clinical judgement   ADL Overall ADL's : Needs assistance/impaired Eating/Feeding: Modified independent   Grooming: Wash/dry face;Oral care;Standing;Min guard                 Lower Body Dressing Details (indicate cue type and reason): educated on figure 4 crossing Toilet Transfer: Min  guard;RW           Functional mobility during ADLs: Min guard;Rolling [place]       Vision Baseline Vision/History: Wears glasses Wears Glasses: At all times     Perception     Praxis      Cognition Arousal/Alertness: Awake/alert Behavior During Therapy: WFL for tasks assessed/performed Overall Cognitive Status: Within Functional Limits for tasks assessed                                          Exercises Exercises: Other exercises Other Exercises Other Exercises: simple horizontal head turns in reclined position (to maximize sensory input) x10 and then focus eyes on a target Other Exercises: slow VOR (horizontal)   Shoulder Instructions       General Comments pt able to utilize gaze stabilization this session to help compensate for vestibular involvement    Pertinent Vitals/ Pain       Pain Assessment: No/denies pain  Home Living Family/patient expects to be discharged to:: Private residence Living Arrangements: Children (daughter) Available Help at Discharge: Family;Available 24 hours/day (daughter works from home) Type of Home: House Home Access: Stairs to enter [employer] of Steps: 1 Entrance Stairs-Rails: None Home Layout: Two level;Able to live on main level with bedroom/bathroom Alternate Level Stairs-Number of Steps: 16 Alternate Level Stairs-Rails: Left Bathroom Shower/Tub: Walk-in shower  Bathroom Toilet: Handicapped height     Home Equipment: None          Prior Functioning/Environment Level of Independence: Independent        Comments: retired   Frequency  Min 2X/week        Progress Toward Goals  OT Goals(current goals can now be found in the care plan section)     Acute Rehab OT Goals Patient Stated Goal: to be able to drive again OT Goal Formulation: With patient Time For Goal Achievement: 12/02/19 Potential to Achieve Goals: Good  Plan      Co-evaluation                 AM-PAC  OT "6 Clicks" Daily Activity     Outcome Measure   Help from another person eating meals?: None Help from another person taking care of personal grooming?: None Help from another person toileting, which includes using toliet, bedpan, or urinal?: A Little Help from another person bathing (including washing, rinsing, drying)?: A Little Help from another person to put on and taking off regular upper body clothing?: None Help from another person to put on and taking off regular lower body clothing?: A Little 6 Click Score: 21    End of Session    OT Visit Diagnosis: Unsteadiness on feet (R26.81);Muscle weakness (generalized) (M62.81)   Activity Tolerance Patient tolerated treatment well   Patient Left in chair;with call bell/phone within reach;with chair alarm set   Nurse Communication Mobility status;Precautions        Time: 7048-8891 OT Time Calculation (min): 28 min  Charges: OT General Charges $OT Visit: 1 Visit OT Treatments $Self Care/Home Management : 23-37 mins   Brynn, OTR/L  Acute Rehabilitation Services Pager: [phone] Office: [phone] .    Jeri Modena 11/18/2019, 3:47 PM

## 2019-11-18 NOTE — Discharge Summary (Addendum)
Name: Suzanne Baldwin MRN: [id number] DOB: Apr 14, 1955 64 y.o. PCP: Bartholome Bill, MD  Date of Admission: 11/16/2019  2:21 PM Date of Discharge: 11/18/19 Attending Physician: Dr. Rebeca Alert Discharge Diagnosis:  Principal Problem:   Vertebral artery dissection [facility]) Active Problems:   HYPERCHOLESTEROLEMIA   TIA (transient ischemic attack)   Diabetes ([place])  1. Acute Left Vertebral Artery Occlusion  2. Hypertension 3. New-Onset Diabetes Mellitus  4. Asymmetric Enlargement of Left Palatine Tonsil   Discharge Medications: Allergies as of 11/18/2019   No Known Allergies      Medication List     STOP taking these medications    amLODipine 10 MG tablet Commonly known as: NORVASC       TAKE these medications    aspirin 81 MG EC tablet Take 1 tablet (81 mg total) by mouth daily. Swallow whole.   atorvastatin 80 MG tablet Commonly known as: LIPITOR Take 1 tablet (80 mg total) by mouth daily. What changed:  medication strength how much to take   brimonidine 0.2 % ophthalmic solution Commonly known as: ALPHAGAN Place 1 drop into both eyes 3 (three) times daily.   clopidogrel 75 MG tablet Commonly known as: PLAVIX Take 1 tablet (75 mg total) by mouth daily.   Combigan 0.2-0.5 % ophthalmic solution Generic drug: brimonidine-timolol Place 1 drop into both eyes 2 (two) times daily.   Fluocinolone Acetonide Body 0.01 % Oil Apply 1 application topically every Monday, Wednesday, and Friday. Apply to thin patches on scalp   latanoprost 0.005 % ophthalmic solution Commonly known as: XALATAN Place 1 drop into both eyes at bedtime.   meloxicam 15 MG tablet Commonly known as: MOBIC Take 15 mg by mouth daily as needed for pain.   nystatin cream Commonly known as: MYCOSTATIN Apply 1 application topically See admin instructions. Apply externally to genital area twice daily (mix with triamcinolone cream)   timolol 0.5 % ophthalmic solution Commonly known as:  TIMOPTIC Place 1 drop into both eyes daily.   triamcinolone cream 0.1 % Commonly known as: KENALOG Apply 1 application topically See admin instructions. Apply externally to genital area twice daily (mix with nystatin cream)   Vitamin D-3 125 MCG (5000 UT) Tabs Take 5,000 Units by mouth daily.        Disposition and follow-up:   Ms.Suzanne Baldwin was discharged from [facility] in Stable condition.  At the hospital follow up visit please address:  1.   A. Acute Left Vertebral Artery Occlusion - Patient started on DAPT therapy with ASA 81mg  and Plavix 75mg  daily; Continue Plavix for 3 months and ASA indefinitely. Ensure patient is taking increased dose Lipitor 80mg  daily. She will benefit from follow up with vestibular PT   B. Hypertension - Patient's home amlodipine was held during admission due to lower pressures and permissive HTN. This will likely need to be restarted at follow-up appointment.    C. New-Onset diabetes - Hb A1c 6.6; unsuccessfully lowered by lifestyle modifications and will likely need medical management with SGLT2 inhibitor vs. GLP-1 agonist; patient has GI intolerance to metformin.   2.  Labs / imaging needed at time of follow-up: BMP (CBG); further workup of enlarged left palatine tonsil   3.  Pending labs/ test needing follow-up: ANA, IFA (with reflex), cardiolipin antibodies (IgG, IgM, IgA), Prothrombin gene mutation, Factor 5 Leiden, Serum Homocysteine, Beta-2 glycoprotein abs, Lupus anticoagulant panel, Protein S, Protein C   Follow-up Appointments:  Follow-up Information     [facility], [place]  Follow up.   Specialty: [place] Why: [phone] The home health agency will contact you for the first home visit.        Bartholome Bill, MD. Schedule an appointment as soon as possible for a visit in 1 week(s).   Specialty: Family Medicine Why: Please schedule an appointment with your primary care provider  within 1 to 2 weeks for follow up of your stroke / diabetes if possible. Contact information: [place] [address] [phone]         Garvin Fila, MD. Schedule an appointment as soon as possible for a visit in 4 week(s).   Specialties: Neurology, Radiology Contact information: [address] [place] [address] [place] Hospital Course by problem list:  1. TIA 2/2 Left Vertebral Artery Occlusion CTA head/neck showed left vertebral artery occlusion without atherosclerosis (favors vertebral artery dissection). CT scan showed findings consistent with white matter changes consistent with small vessel disease, but no acute intracranial abnormalities. MRI brain confirmed no acute intracranial abnormalities. ECHO showed no source of emboli with preserved EF and no regional wall motion abnormalities. 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 ANA and hypercoagulation labs pending. LDL elevated to 191, Hb A1c 6.6. B12, folate, and anti-thrombin III within normal limits. TSH wnl. RPR is non-reactive. Neurology recommended medical management, and her home Lipitor was increased to 80mg  daily, she was started on DAPT with ASA and Plavix; Plavix 75mg  to be discontinued after 3 months.    2. Hypertension Patient takes amlodipine 10mg  at home, but had lower blood pressures in the hospital. Home amlodipine was held for permissive hypertension.    3. New-Onset Diabetes Hb A1c 6.6. Previously diagnosed with pre-diabetes, not on glucose-lowering agents at home. Patient was started on SSI in the hospital, but will likely require medical management outpatient. Endorses GI intolerance to Metformin but has not tried other glucose lowering agents and has unsuccessfully tried lifestyle modifications.    4. Asymmetric Enlargement of L Palatine Tonsil CTA head/neck  showed no cervical lymphadenopathy; however, there was asymmetric enlargement of the left palatine tonsil (series 12, image 99). Superimposed postinflammatory calcifications are noted in that tonsil. No acute inflammation in the deep soft tissue spaces of the Neck. Patient will require outpatient follow up with ENT. HIV non-reactive.   Discharge Vitals:   BP 138/84 (BP Location: Right Arm)   Pulse 61   Temp 98 F (36.7 C) (Oral)   Resp 18   Ht 5\' 8"  (1.727 m)   Wt 92.5 kg   SpO2 100%   BMI 31.02 kg/m   Pertinent Labs, Studies, and Procedures:   Lipid Panel:  Cholesterol 266  LDL 191  HDL 63  TG 61 Hemoglobin A1c - 6.6 Creatinine 1.05 K+ 3.3 Calcium 7.0  CT ANGIOGRAPHY HEAD AND NECK CONTRAST:  85mL OMNIPAQUE IOHEXOL 350 MG/ML SOLN FINDINGS: CT HEAD Brain: Normal cerebral volume. No midline shift, ventriculomegaly, mass effect, evidence of mass lesion, intracranial hemorrhage or evidence of cortically based acute infarction. Patchy bilateral cerebral white matter hypodensity, most pronounced in the periatrial regions. Elsewhere gray-white matter differentiation is within normal limits. Mild dystrophic calcifications in the bilateral basal ganglia. No cortical encephalomalacia identified.  CTA NECK Skeleton: Absent maxillary dentition. Levoconvex cervicothoracic scoliosis. No acute osseous abnormality identified. Upper chest: Negative. Other neck: There  is no cervical lymphadenopathy. But there is asymmetric enlargement of the left palatine tonsil (series 12, image 99). Superimposed postinflammatory calcifications are noted in that tonsil. No acute inflammation in the deep soft tissue spaces of the neck. Aortic arch: 3 vessel arch configuration.  No arch atherosclerosis. Right carotid system: Negative. Left carotid system: Negative aside from tortuosity. Vertebral arteries: The proximal right subclavian artery and right vertebral arteries are normal. Proximal  left subclavian artery is normal but the left vertebral artery is occluded at its origin (series 12, image 148 and series 11, image 2 fifty-eight. There is no enhancement of the left V1 segment. There is only thread-like V2 enhancement at C2-C3 which appears related to supply from a muscular branch. Largely absent V3 enhancement.   CTA HEAD Posterior circulation: The right vertebral V4 segment is normal to the basilar. A small left V4 segment is reconstituted, including the left PICA. There is no distal vertebral stenosis. The right PICA origin is normal. Patent basilar artery without stenosis. Normal SCA and right PCA origins. Fetal type left PCA origin. Right posterior communicating artery diminutive or absent. Bilateral PCA branches are within normal limits. Anterior circulation: The left ICA siphon appears dominant and is mildly dolichoectatic. Both ICA siphons are tortuous and patent with no plaque or stenosis. Normal ophthalmic and left posterior communicating artery origins. Patent carotid termini. The left ACA A1 segment is dominant, the right diminutive or absent. Anterior communicating artery and bilateral ACA branches are within normal limits. Normal MCA origins. The left MCA bifurcates early without stenosis. Right MCA M1 and bifurcation are patent without stenosis. Bilateral MCA branches are within normal limits. Venous sinuses: Patent. Anatomic variants: Fetal type left PCA origin. Dominant left and diminutive or absent right ACA A1 segments.   IMPRESSION: 1. 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 The Left V4 segment and PICA are reconstituted intracranially. 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 2. No acute intracranial abnormality by CT. Bilateral cerebral white matter changes compatible with small vessel disease. 3. Generalized arterial tortuosity  elsewhere. No plaque or stenosis in the head or neck. 4. Asymmetric enlargement of the left palatine tonsil. No associated cervical lymphadenopathy. Recommend outpatient ENT follow-up.   Electronically Signed: By: Genevie Ann M.D. On: 11/16/2019 18:30  MRI HEAD WITHOUT CONTRAST FINDINGS: Brain: No acute infarct, acute hemorrhage or extra-axial collection. Multifocal hyperintense T2-weighted signal within the white matter. Normal volume of CSF spaces. No chronic microhemorrhage. Normal midline structures. Vascular: Normal flow voids.  IMPRESSION: 1. No acute intracranial abnormality. 2. Multifocal hyperintense T2-weighted signal within the white matter, most commonly chronic ischemic microangiopathy.   Electronically Signed   By: Ulyses Jarred M.D.   On: 11/17/2019 04:04  ECHO 11/17/19: LVEF 60-65%, normal LV function, no wall motion abnormalities.  Moderate concentric LV hypertrophy.  LV diastolic parameters consistent with Grade I DD.  Trivial MR   Vascular duplex Ultrasound 11/18/19: No DVT noted.  Discharge Instructions: Discharge Instructions     Ambulatory referral to Neurology   Complete by: As directed    Follow up with Dr. Leonie Man at [facility] in 4 weeks. Too complicated for NP to follow. Thanks.   Call MD for:  difficulty breathing, headache or visual disturbances   Complete by: As directed    Call MD for:  persistant dizziness or light-headedness   Complete by: As directed    Call MD for:  persistant nausea and  vomiting   Complete by: As directed    Diet - low sodium heart healthy   Complete by: As directed    Diet Carb Modified   Complete by: As directed    Discharge instructions   Complete by: As directed    [name], you were admitted to the hospital for a blockage of your left vertebral artery. This may have occurred spontaneously or due to an underlying condition that makes your blood more prone to blood clots. You were not found to have any other blood clots  while in the hospital.   Because of your stroke, you were started on Aspirin and Plavix, and your statin medications (Atorvastatin) dosage was increased. Due to lower blood pressures while you were in the hospital, your home blood pressure medication (Amlodipine) was stopped.   You were found to have a new diagnosis of diabetes (although your sugars were just barely in the diabetic range). You will need close follow up with your PCP to start medication for your diabetes and keep up the good work with exercise and diet. You were also found to have enlargement of one of your tonsils, and should note this when you see your primary care physician for possible follow up with an [place] (ENT) physician.   When you leave the hospital, please START taking: Aspirin 1 tablet daily (81mg ) - lifelong  Plavix 1 tablet daily - for 3 months then stop  Please take your NEW prescription of Atorvastatin, 80mg  (1 tablet) daily  Please STOP taking Amlodipine (your blood pressures were lower in the hospital).  Please follow up with your PCP physician within the next 1-2 weeks. Also please follow up with Dr. Leonie Man from neurology. You will receive a call to schedule an appointment and will also receive a call from cardiology to schedule an appointment to have a cardiac monitor placed to monitor your heart rate.  Thank you and I hope you continue to feel better.   Increase activity slowly   Complete by: As directed        Signed: Jeralyn Bennett, MD 11/19/2019, 5:36 AM   Pager: [phone]

## 2019-11-18 NOTE — Progress Notes (Signed)
VASCULAR LAB    Carotid duplex has been performed.  See CV proc for preliminary results.   Manal Kreutzer, RVT 11/18/2019, 3:30 PM

## 2019-11-18 NOTE — Progress Notes (Signed)
   Subjective:   Suzanne Baldwin states that her dizziness, nausea, and headache have improved. She says that she only feels mildly dizzy when she gets up to ambulate. She denies any CP, SOB or any other symptoms.   Objective:  Vital signs in last 24 hours: Vitals:   11/18/19 0000 11/18/19 0400 11/18/19 0824 11/18/19 1150  BP: 115/72 112/78 134/78 121/87  Pulse: (!) 59 66 60 62  Resp: 19 18 20 20   Temp: 97.7 F (36.5 C) 97.7 F (36.5 C) 97.8 F (36.6 C) 97.8 F (36.6 C)  TempSrc: Oral Oral Oral Oral  SpO2: 99% 100% 100% 100%  Weight:      Height:       General: Patient appears well. No acute distress. Eyes: Sclera non-icteric. No conjunctival injection.  Respiratory: Lungs are CTA, bilaterally. No wheezes, rales, or rhonchi.  Cardiovascular: Regular rate and rhythm. No murmurs, rubs, or gallops.  Abdominal: Soft and non-tender to palpation. Bowel sounds intact. Skin: No lesions. No rashes.  Psych: Normal affect. Normal tone of voice.   Assessment/Plan:  Principal Problem:   Vertebral artery dissection (H[facility]) Active Problems:   HYPERCHOLESTEROLEMIA   TIA (transient ischemic attack)   Diabetes ([place])  # TIA 2/2 Left Vertebral Artery Occlusion  MRA of the head and neck showed acute occlusion of the L vertebral artery. Brain CT and MRI negative for signs of ischemia or hemorrhage. Neurology note history of Protein S deficiency 11/2008. May be secondary to hypercoagulable disorder vs. Spontaneous vertebral artery dissection. LDL elevated. B12, Folate, ATIII wnl, RPR non-reactive. - Neurology on board and recommend medical management  - Continue Lipitor 80mg  daily - Continue ASA 81mg  daily and Plavix DAPT  - ECHO pending  - ANA, hypercoagulable workup pending   # Hypertension Blood pressures around goal - continue to hold home amlodipine for now - monitor closely   # New-Onset Diabetes Hb A1c 6.6. Previously diagnosed with pre-diabetes, not on glucose-lowering agents at home.   - Will start Metformin at discharge with PCP follow up  # Asymmetric Enlargement of L Palatine Tonsil - HIV test pending - Patient will need outpatient follow up with ENT  Prior to Admission Living Arrangement: Home Anticipated Discharge Location: Home Barriers to Discharge: ECHO, hematologic workup  Dispo: Anticipated discharge in approximately 0-1 days.  Jeralyn Bennett, MD 11/18/2019, 1:52 PM Pager: [phone] After 5pm on weekdays and 1pm on weekends: On Call pager [phone]

## 2019-11-18 NOTE — Plan of Care (Signed)

## 2019-11-18 NOTE — Progress Notes (Signed)
Patient discharged to home with home health care  in wheel chair, patient's daughter picked her up at 2128 from [facility] front door, took all pt's belongings with her.

## 2019-11-18 NOTE — Evaluation (Signed)
Physical Therapy Evaluation Patient Details Name: Suzanne Baldwin MRN: [id number] DOB: 11-02-1955 Today's Date: 11/18/2019   History of Present Illness  64 yo female presented 11/16/19 with dizziness vertigo (intermittent for 2 weeks) s/p falls. workup for TIA 2/2 L vertebral artery dissection  PMH DM HTN HLD breast CA   Clinical Impression   Pt admitted with above diagnosis. Patient demonstrating central vestibular symptoms during assessment (falls posterior when closes eyes in standing; severely diminished VOR with onset of nausea; increased nausea with changes in position that subside with increased tactile sensory input). She was educated in compensatory techniques to minimize symptoms and improve safety with mobility, however also educated that she needs vestibular rehab to regain ability to tolerate mobility/motion without dizziness, nausea, and imbalance. She is agreeable. Pt currently with functional limitations due to the deficits listed below (see PT Problem List). Pt will benefit from skilled PT to increase their independence and safety with mobility to allow discharge to the venue listed below.  Feel she will need HHPT initially due to severity of symptoms (nausea) with mobility and unsure she can tolerate riding in car to get to OPPT.      Follow Up Recommendations Home health PT (Vestibular Rehab)    Equipment Recommendations  None recommended by PT    Recommendations for Other Services OT consult     Precautions / Restrictions Precautions Precautions: Fall Precaution Comments: +vertigo   Vestibular Assessment   11/18/19 0001  Symptom Behavior  Subjective history of current problem nausea, imbalance, dizziness for 2+ weeks  Type of Dizziness  Imbalance;Unsteady with head/body turns;"Funny feeling in head"  Frequency of Dizziness with movement  Symptom Nature Spontaneous;Motion provoked  Aggravating Factors Sitting with head tilted back;Turning head quickly;Forward  bending  Relieving Factors Head stationary (avoiding provoking positions)  Progression of Symptoms Better  History of similar episodes none  Oculomotor Exam  Oculomotor Alignment Normal  Spontaneous Absent  Head shaking Horizontal Absent (however very dizzy and could not hold her head/torso steady )  Vestibulo-Ocular Reflex  VOR 1 Head Only (x 1 viewing) eyes became dysconjugate even with very slow head turns only 30 degrees to each side and stopped after 2 reps of head turning due to nausea     Mobility  Bed Mobility               General bed mobility comments: up in chair  Transfers Overall transfer level: Independent Equipment used: None                Ambulation/Gait Ambulation/Gait assistance: Modified independent (Device/Increase time) Gait Distance (Feet): 300 Feet Assistive device: None Gait Pattern/deviations: Step-through pattern;Antalgic;Decreased stride length Gait velocity: too slow initially with feeling more off-balance; improved with cues to incr velocity andpt agreed felt more secure   General Gait Details: antalgic due to rt sciatica; no drift with head turns (slow head turns)  Science writer    Modified Rankin (Stroke Patients Only) Modified Rankin (Stroke Patients Only) Pre-Morbid Rankin Score: No symptoms Modified Rankin: Moderately severe disability     Balance Overall balance assessment: Needs assistance Sitting-balance support: No upper extremity supported;Feet supported Sitting balance-Leahy Scale: Good Sitting balance - Comments: shifting outside BOS without difficulty   Standing balance support: No upper extremity supported Standing balance-Leahy Scale: Fair           Rhomberg - Eyes Opened: 20 Rhomberg - Eyes Closed: 0 (immediate posterior LOB)   High  Level Balance Comments: atttempted eyes closed with feet shoulder width; pt with posterior lean and initially corrected, but then could not  correct with taking step backwards             Pertinent Vitals/Pain Pain Assessment: No/denies pain    Home Living Family/patient expects to be discharged to:: Private residence Living Arrangements: Children (daughter) Available Help at Discharge: Family;Available 24 hours/day (daughter works from home) Type of Home: House Home Access: Stairs to enter Entrance Stairs-Rails: None Technical brewer of Steps: 1 Home Layout: Two level;Able to live on main level with bedroom/bathroom Home Equipment: None      Prior Function Level of Independence: Independent         Comments: retired     Engineer, manufacturing Dominance   Dominant Hand: Right    Extremity/Trunk Assessment   Upper Extremity Assessment Upper Extremity Assessment: Defer to OT evaluation    Lower Extremity Assessment Lower Extremity Assessment: Overall WFL for tasks assessed    Cervical / Trunk Assessment Cervical / Trunk Assessment: Other exceptions  Communication   Communication: No difficulties  Cognition Arousal/Alertness: Awake/alert Behavior During Therapy: WFL for tasks assessed/performed Overall Cognitive Status: Within Functional Limits for tasks assessed                                        General Comments General comments (skin integrity, edema, etc.): Incr time for vestibular assessment and education on vestibular system.     Exercises Other Exercises Other Exercises: simple horizontal head turns in reclined position (to maximize sensory input) x10 and then focus eyes on a target Other Exercises: slow VOR (horizontal)   Assessment/Plan    PT Assessment Patient needs continued PT services  PT Problem List Decreased activity tolerance;Decreased balance;Decreased mobility;Decreased knowledge of use of DME;Decreased knowledge of precautions;Pain       PT Treatment Interventions DME instruction;Gait training;Stair training;Functional mobility training;Therapeutic  activities;Therapeutic exercise;Balance training;Neuromuscular re-education;Patient/family education    PT Goals (Current goals can be found in the Care Plan section)  Acute Rehab PT Goals Patient Stated Goal: be able to travel again PT Goal Formulation: With patient Time For Goal Achievement: 12/02/19 Potential to Achieve Goals: Good    Frequency Min 4X/week   Barriers to discharge Decreased caregiver support daughter works from home, however     Co-evaluation               AM-PAC PT "6 Clicks" Mobility  Outcome Measure Help needed turning from your back to your side while in a flat bed without using bedrails?: None Help needed moving from lying on your back to sitting on the side of a flat bed without using bedrails?: None Help needed moving to and from a bed to a chair (including a wheelchair)?: None Help needed standing up from a chair using your arms (e.g., wheelchair or bedside chair)?: None Help needed to walk in hospital room?: None Help needed climbing 3-5 steps with a railing? : A Little 6 Click Score: 23    End of Session Equipment Utilized During Treatment: Gait belt Activity Tolerance: Patient tolerated treatment well Patient left: in chair;with call bell/phone within reach;with chair alarm set Nurse Communication: Mobility status;Other (comment) (+symptoms with head positioning to watch TV) PT Visit Diagnosis: Dizziness and giddiness (R42);Other symptoms and signs involving the nervous system (O27.035)    Time: 0093-8182 PT Time Calculation (min) (ACUTE ONLY): 46 min  Charges:   PT Evaluation $PT Eval High Complexity: 1 High PT Treatments $Therapeutic Exercise: 8-22 mins $Self Care/Home Management: 8-22         Arby Barrette, PT Pager [phone]   Rexanne Mano 11/18/2019, 1:51 PM

## 2019-11-19 ENCOUNTER — Telehealth: Payer: Self-pay | Admitting: *Deleted

## 2019-11-19 ENCOUNTER — Encounter: Payer: Self-pay | Admitting: *Deleted

## 2019-11-19 LAB — PROTEIN S, TOTAL: Protein S Ag, Total: 86 % (ref 60–150)

## 2019-11-19 LAB — BETA-2-GLYCOPROTEIN I ABS, IGG/M/A
Beta-2 Glyco I IgG: <9 GPI IgG units (ref 0–20)
Beta-2-Glycoprotein I IgA: 19 GPI IgA units (ref 0–25)
Beta-2-Glycoprotein I IgM: <9 GPI IgM units (ref 0–32)

## 2019-11-19 LAB — LUPUS ANTICOAGULANT PANEL
DRVVT: 37.6 s (ref 0.0–47.0)
PTT Lupus Anticoagulant: 30.7 s (ref 0.0–51.9)

## 2019-11-19 LAB — PROTEIN S ACTIVITY: Protein S Activity: 52 % — ABNORMAL LOW (ref 63–140)

## 2019-11-19 LAB — PROTEIN C, TOTAL: Protein C, Total: 117 % (ref 60–150)

## 2019-11-19 LAB — PROTEIN C ACTIVITY: Protein C Activity: 151 % (ref 73–180)

## 2019-11-19 LAB — ANTINUCLEAR ANTIBODIES, IFA: ANA Ab, IFA: NEGATIVE

## 2019-11-19 LAB — HOMOCYSTEINE: Homocysteine: 13.7 umol/L (ref 0.0–17.2)

## 2019-11-19 NOTE — Telephone Encounter (Signed)
Patient enrolled for [employer] to ship a 30 day cardiac event monitor to her home.  Letter with instructions to be mailed to patient.

## 2019-11-20 LAB — CARDIOLIPIN ANTIBODIES, IGG, IGM, IGA
Anticardiolipin IgA: <9 APL U/mL (ref 0–11)
Anticardiolipin IgG: <9 GPL U/mL (ref 0–14)
Anticardiolipin IgM: 9 MPL U/mL (ref 0–12)

## 2019-11-21 LAB — PROTHROMBIN GENE MUTATION

## 2019-11-22 LAB — FACTOR 5 LEIDEN

## 2019-11-25 ENCOUNTER — Ambulatory Visit (INDEPENDENT_AMBULATORY_CARE_PROVIDER_SITE_OTHER): Payer: Medicare Other

## 2019-11-25 DIAGNOSIS — G459 Transient cerebral ischemic attack, unspecified: Secondary | ICD-10-CM

## 2019-11-25 DIAGNOSIS — R42 Dizziness and giddiness: Secondary | ICD-10-CM | POA: Diagnosis not present

## 2019-11-25 DIAGNOSIS — I4891 Unspecified atrial fibrillation: Secondary | ICD-10-CM | POA: Diagnosis not present

## 2019-11-25 DIAGNOSIS — R002 Palpitations: Secondary | ICD-10-CM | POA: Diagnosis not present

## 2019-12-08 ENCOUNTER — Other Ambulatory Visit: Payer: Self-pay | Admitting: Student

## 2019-12-11 ENCOUNTER — Encounter: Payer: Self-pay | Admitting: Adult Health

## 2019-12-11 ENCOUNTER — Other Ambulatory Visit: Payer: Self-pay | Admitting: Student

## 2019-12-11 ENCOUNTER — Ambulatory Visit: Payer: Medicare Other | Admitting: Adult Health

## 2019-12-11 VITALS — BP 127/80 | HR 72 | Ht 68.0 in | Wt 208.0 lb

## 2019-12-11 DIAGNOSIS — E119 Type 2 diabetes mellitus without complications: Secondary | ICD-10-CM

## 2019-12-11 DIAGNOSIS — I7774 Dissection of vertebral artery: Secondary | ICD-10-CM | POA: Diagnosis not present

## 2019-12-11 DIAGNOSIS — I10 Essential (primary) hypertension: Secondary | ICD-10-CM

## 2019-12-11 DIAGNOSIS — D6859 Other primary thrombophilia: Secondary | ICD-10-CM | POA: Diagnosis not present

## 2019-12-11 DIAGNOSIS — E785 Hyperlipidemia, unspecified: Secondary | ICD-10-CM | POA: Diagnosis not present

## 2019-12-11 NOTE — Progress Notes (Signed)
I agree with the above plan 

## 2019-12-11 NOTE — Patient Instructions (Addendum)
We will plan on repeating CTA neck 3 months after your hospitalization which would be around mid December  We will repeat protein S level as this was low during recent admission. A low level can increase your risk of clotting which could have contributed to recent TIA or mini stroke  Continue aspirin 81 mg daily and clopidogrel 75 mg daily  and atorvastatin 80mg  daily  for secondary stroke prevention  We will follow up on cardiac monitor for possible atrial fibrillation which could have contributed to recent event  Continue both aspirin and plavix for additional 2 months and then aspirin alone  Continue to follow up with PCP regarding cholesterol and blood pressure management  Maintain strict control of hypertension with blood pressure goal below 130/90 and cholesterol with LDL cholesterol (bad cholesterol) goal below 70 mg/dL.      Followup in the future with me in 3 months or call earlier if needed      Thank you for coming to see [place] at [facility]. I hope we have been able to provide you high quality care today.  You may receive a patient satisfaction survey over the next few weeks. We would appreciate your feedback and comments so that we may continue to improve ourselves and the health of our patients.     Vertebral Artery Dissection  Vertebral artery dissection is a tear in a vertebral artery. The vertebral arteries are major blood vessels at the base of the neck. They carry blood from the heart to the brain. When an artery tears, blood collects inside the layers of the artery wall. This can cause a blood clot. This condition increases the risk for stroke if it is not diagnosed and treated right away. It is a common cause of stroke in people who are 60-14 years old. What are the causes? This condition may be caused by:  A neck injury due to sudden or too much neck movement.  Having weak blood vessel walls. The walls may tear even when no injury occurs  (spontaneous dissection). What increases the risk? The following factors may make you more likely to develop this condition:  High blood pressure (hypertension).  Migraines.  Inherited diseases that affect the strength or shape of the blood vessels. What are the signs or symptoms? Symptoms usually appear within days of an injury, but sometimes they may not appear for weeks or years. Common symptoms of this condition include:  Stabbing, sharp pain in the head, neck, eye, or face.  Dizziness.  Vertigo. This is a feeling that you or things around you are moving when they are not.  Double vision. Other symptoms include:  Hoarse voice.  Hearing loss.  Hiccups.  Loss of taste.  Difficulty speaking.  Loss of balance.  Difficulty swallowing.  Ear pain.  Nausea and vomiting.  Loss of feeling in the torso, legs, or arms. How is this diagnosed? This condition may be diagnosed based on tests, such as:  CT angiogram. X-ray images of your vertebral arteries are taken. A dye makes the images clear.  MRI angiogram. This is used to check the health of blood vessels.  Cerebral angiogram. X-ray images of blood vessels in the brain and neck are taken. A dye makes the images clear.  Doppler ultrasound. This test creates images using sound waves. It shows how well blood flows through your arteries. How is this treated? Treatment depends on the cause of your condition and on your overall health. The goal of treatment is  to prevent a stroke. If you are having a stroke, it is important to get treatment quickly. Treatment may include:  Blood thinners. This medicine helps to prevent blood clots. This may be given first through an IV, and then as pills for 3-6 months.  Procedures to widen a narrow blood vessel (angioplasty) or to place a mesh tube (stent) inside the blood vessel to keep it open.  Surgery to repair the area. This is rarely needed. Follow these instructions at  home: Medicines  Take over-the-counter and prescription medicines only as told by your health care provider.  If you are taking blood thinners: ? Talk with your health care provider before you take any medicines that contain aspirin or NSAIDs. These medicines increase your risk for dangerous bleeding. ? Take your medicine exactly as told, at the same time every day. ? Avoid activities that could cause injury or bruising. ? Follow instructions about how to prevent falls. ? Wear a medical alert bracelet or carry a card that lists what medicines you take. Lifestyle   Work with your health care provider to control hypertension. This may include: ? Exercising regularly. Check with your health care provider before starting a new type of exercise. ? Eating a heart-healthy diet of fruits, vegetables, whole grains, and lean meats. Limit unhealthy fats and eat more healthy fats such as avocados, eggs, and oily fish. ? Reducing the amount of salt (sodium) that you eat to less than 1,500 mg a day. ? Reducing stress by doing things that you enjoy and avoiding things that cause you stress.  Do not use any products that contain nicotine or tobacco, such as cigarettes, e-cigarettes, and chewing tobacco. If you need help quitting, ask your health care provider. General instructions  If you drink alcohol: ? Limit how much you use to:  0-1 drink a day for women.  0-2 drinks a day for men. ? Be aware of how much alcohol is in your drink. In the [place] one drink equals one 12 oz bottle of beer (355 mL), one 5 oz glass of wine (148 mL), or one 1 oz of hard liquor (44 mL).  Keep all follow-up visits as told by your health care provider. This is important. Contact a health care provider if you:  Feel weak or dizzy.  Have a fever. Get help right away if:  You have any symptoms of a stroke. "BE FAST" is an easy way to remember the main warning signs of a stroke: ? B - Balance. Signs are dizziness,  sudden trouble walking, or loss of balance. ? E - Eyes. Signs are trouble seeing or a sudden change in vision. ? F - Face. Signs are sudden weakness or numbness of the face, or the face or eyelid drooping on one side. ? A - Arms. Signs are weakness or numbness in an arm. This happens suddenly and usually on one side of the body. ? S - Speech. Signs are sudden trouble speaking, slurred speech, or trouble understanding what people say. ? T - Time. Time to call emergency services. Write down what time symptoms started.  You have other signs of a stroke, such as: ? A sudden, severe headache with no known cause. ? Nausea or vomiting. ? Seizure.  You have other symptoms, such as: ? Difficulty breathing. ? Chest pain. These symptoms may represent a serious problem that is an emergency. Do not wait to see if the symptoms will go away. Get medical help right away. Call your  local emergency services (911 in the [place] Do not drive yourself to the hospital. Summary  Vertebral artery dissection is a tear in an artery that carries blood from the heart to the brain.  Symptoms usually appear within days of an injury, but sometimes they may not appear for weeks or years.  This condition increases the risk for stroke if it is not diagnosed and treated right away.  Treatment depends on the cause of your condition and on your overall health. The goal of treatment is to prevent a stroke.  Get help right away if you have any symptoms of a stroke. This information is not intended to replace advice given to you by your health care provider. Make sure you discuss any questions you have with your health care provider. Document Revised: 11/09/2017 Document Reviewed: 11/09/2017 Elsevier Patient Education  2020 [employer].

## 2019-12-11 NOTE — Progress Notes (Signed)
[facility] [address] [place]. [place] [address] [phone]       [place] Tyrica Afzal Date of Birth:  [birth year] Medical Record Number:  [id number]   Reason for Referral:  hospital stroke follow up    SUBJECTIVE:   CHIEF COMPLAINT:  Chief Complaint  Patient presents with  . Hospitalization Follow-up    pt reports fatigued, no complaints of numbness or pain   . Cerebrovascular Accident    room 9, alone     HPI:   [name] is a 64 y.o. female with history of diabetes mellitus, hyperlipidemia, hypertension, sickle cell trait, hx of a clotting disorder, and breast cancer in remission  who presented on 11/16/2019 with intermittent episodes of vertigo for 2 weeks followed by generalized weakness, nausea, increasing fatigue, tachycardia,  palpitations, and a left-sided throbbing headache with neck pain. 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   Suspicion for dissection as there was no other evidence of atherosclerosis.  MRI negative for acute abnormality and likely TIA due to L VA occlusion vs dissection and recommended ruling out occult A. fib or clotting disorder.  Recommended DAPT for 3 months and then aspirin alone.  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  History of palpitations and recommended 30-day cardiac event monitor outpatient to rule out A. fib.  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  HTN stable.  LDL 191 and increase atorvastatin from 10 mg to 80 mg daily.  Controlled DM with A1c 6.6.  Other stroke risk factors include advanced age, obesity and family history of stroke.  No personal history of stroke.  Other active problems include  asymmetric enlargement of left palatine tonsil and recommended ENT follow-up outpatient as well as hypokalemia.  Evaluated by therapy and recommended discharge home with [facility] PT and discharged home in stable condition.  TIA due to Left vertebral ? occlusion ? Dissection - need to rule out occult afib or clotting disorder  MRI head - No acute intracranial abnormality. Multifocal hyperintense T2-weighted signal within the white matter, most commonly chronic ischemic microangiopathy.   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 The Left V4 segment and PICA are reconstituted intracranially.   2D Echo - EF 60-65%. No source of embolus   LE dopplers no DVT  Lacey Jensen Virus 2 - negative  UDS neg  LDL - 191  HgbA1c - 6.6  VTE prophylaxis - SCDs  No antithrombotic prior to admission, now on aspirin 81 mg daily and plavix DAPT for 3 months and then ASA alone  Patient counseled to be compliant with her antithrombotic medications  Ongoing aggressive stroke risk factor management  Therapy recommendations:  HH PT  Disposition:  home  Today, 12/11/2019, Suzanne Baldwin is being seen for hospital follow-up. She has been doing well since discharge but reports she still doesn't feel completely back to normal. Denies continued vertigo/dizziness, headache, numbness, or nausea.  Denies these symptoms with any type of head movement or position.  She has been doing PT/OT at home and completed PT yesterday. She hasn't returned back to all prior activities yet and is wondering if she is able  to return back to driving, aquatic therapy and dry needling treatment for right piriformis muscle and left shoulder pain. denies new or worsening stroke/TIA symptoms.  Continues on aspirin and Plavix without bleeding or bruising.  Continues on atorvastatin without myalgias.  Blood pressure today 127/80.  Completed cardiac monitor 2 days ago but only able to complete  3 weeks due to adhesive irritation.  She has multiple questions today regarding etiology with possible causes, possible future imaging to assess vertebral artery and restrictions.  No further concerns at this time.    ROS:   14 system review of systems performed and negative with exception of those listed in HPI  PMH:  Past Medical History:  Diagnosis Date  . Allergy   . Arthritis   . Breast disorder    breast cancer 25 years ago  . Cancer ([place])   . Clotting disorder ([place])   . Diabetes mellitus without complication ([place])   . Glaucoma   . Hyperlipidemia   . Hypertension   . Sickle cell trait ([facility])     PSH:  Past Surgical History:  Procedure Laterality Date  . CESAREAN SECTION    . COLONOSCOPY    . COSMETIC SURGERY    . FRACTURE SURGERY    . POLYPECTOMY    . ROTATOR CUFF REPAIR    . TUBAL LIGATION      Social History:  Social History   Socioeconomic History  . Marital status: Single    Spouse name: Not on file  . Number of children: Not on file  . Years of education: Not on file  . Highest education level: Not on file  Occupational History  . Not on file  Tobacco Use  . Smoking status: Never Smoker  . Smokeless tobacco: Never Used  Substance and Sexual Activity  . Alcohol use: No  . Drug use: No  . Sexual activity: Never  Other Topics Concern  . Not on file  Social History Narrative  . Not on file   Social Determinants of Health   Financial Resource Strain:   . Difficulty of Paying Living Expenses: Not on file  Food Insecurity:   . Worried About Charity fundraiser in the Last Year: Not on file  . Ran Out of Food in the Last Year: Not on file  Transportation Needs:   . Lack of Transportation (Medical): Not on file  . Lack of Transportation (Non-Medical): Not on file  Physical Activity:   . Days of Exercise per Week: Not on file  . Minutes of Exercise per Session: Not on file  Stress:   . Feeling of Stress : Not on file  Social Connections:   .  Frequency of Communication with Friends and Family: Not on file  . Frequency of Social Gatherings with Friends and Family: Not on file  . Attends Religious Services: Not on file  . Active Member of Clubs or Organizations: Not on file  . Attends Archivist Meetings: Not on file  . Marital Status: Not on file  Intimate Partner Violence:   . Fear of Current or Ex-Partner: Not on file  . Emotionally Abused: Not on file  . Physically Abused: Not on file  . Sexually Abused: Not on file    Family History:  Family History  Problem Relation Age of Onset  . Diabetes Father   . Hypertension Father   . Stroke Father   . Diabetes Mother   . Hypertension Mother   . Breast cancer  Mother   . Cancer Mother   . Heart disease Maternal Grandmother   . Colon cancer Neg Hx   . Esophageal cancer Neg Hx   . Stomach cancer Neg Hx   . Rectal cancer Neg Hx     Medications:   Current Outpatient Medications on File Prior to Visit  Medication Sig Dispense Refill  . aspirin EC 81 MG EC tablet Take 1 tablet (81 mg total) by mouth daily. Swallow whole. 30 tablet 11  . atorvastatin (LIPITOR) 80 MG tablet Take 1 tablet (80 mg total) by mouth daily. 30 tablet 0  . brimonidine (ALPHAGAN) 0.2 % ophthalmic solution Place 1 drop into both eyes 3 (three) times daily.    . brimonidine-timolol (COMBIGAN) 0.2-0.5 % ophthalmic solution Place 1 drop into both eyes 2 (two) times daily.     . Cholecalciferol (VITAMIN D-3) 125 MCG (5000 UT) TABS Take 5,000 Units by mouth daily.    . clopidogrel (PLAVIX) 75 MG tablet Take 1 tablet (75 mg total) by mouth daily. 30 tablet 0  . Fluocinolone Acetonide Body 0.01 % OIL Apply 1 application topically every Monday, Wednesday, and Friday. Apply to thin patches on scalp    . latanoprost (XALATAN) 0.005 % ophthalmic solution Place 1 drop into both eyes at bedtime.    . meloxicam (MOBIC) 15 MG tablet Take 15 mg by mouth daily as needed for pain.     Marland Kitchen nystatin cream  (MYCOSTATIN) Apply 1 application topically See admin instructions. Apply externally to genital area twice daily (mix with triamcinolone cream)    . timolol (TIMOPTIC) 0.5 % ophthalmic solution Place 1 drop into both eyes daily.    Marland Kitchen triamcinolone cream (KENALOG) 0.1 % Apply 1 application topically See admin instructions. Apply externally to genital area twice daily (mix with nystatin cream)     No current facility-administered medications on file prior to visit.    Allergies:  No Known Allergies    OBJECTIVE:  Physical Exam  Vitals:   12/11/19 0858  BP: 127/80  Pulse: 72  Weight: 208 lb (94.3 kg)  Height: 5\' 8"  (1.727 m)   Body mass index is 31.63 kg/m. No exam data present  Post stroke PHQ 2-9 Depression screen [facility] 2/9 12/11/2019  Decreased Interest 0  Down, Depressed, Hopeless 0  PHQ - 2 Score 0     General: well developed, well nourished,  very pleasant middle-age African-American female, seated, in no evident distress Head: head normocephalic and atraumatic.   Neck: supple with no carotid or supraclavicular bruits Cardiovascular: regular rate and rhythm, no murmurs Musculoskeletal: no deformity Skin:  no rash/petichiae Vascular:  Normal pulses all extremities   Neurologic Exam Mental Status: Awake and fully alert.   Fluent speech and language.  Oriented to place and time. Recent and remote memory intact. Attention span, concentration and fund of knowledge appropriate. Mood and affect appropriate.  Cranial Nerves: Fundoscopic exam reveals sharp disc margins. Pupils equal, briskly reactive to light. Extraocular movements full without nystagmus. Visual fields full to confrontation. Hearing intact. Facial sensation intact. Face, tongue, palate moves normally and symmetrically.  Motor: Normal bulk and tone. Normal strength in all tested extremity muscles. Sensory.: intact to touch , pinprick , position and vibratory sensation.  Coordination: Rapid alternating movements  normal in all extremities. Finger-to-nose and heel-to-shin performed accurately bilaterally. Gait and Station: Arises from chair without difficulty. Stance is normal. Gait demonstrates normal stride length and balance without use of assistive device.  Able to tandem walk and  heel toe without difficulty.  Romberg negative. Reflexes: 1+ and symmetric. Toes downgoing.     NIHSS  0 Modified Rankin  2      ASSESSMENT: Timmy Cleverly is a 64 y.o. year old female presented with intermittent episodes of vertigo for 2 weeks followed by generalized weakness, nausea, increasing fatigue, tachycardia, palpitations and a left-sided throbbing headache with neck pain on 11/16/2019 likely TIA due to left VA occlusion vs dissection secondary to possibly occult A. fib or clotting disorder. Vascular risk factors include HTN, HLD, DM, sickle cell trait, hx of clotting disorder, hx of DVT, and breast cancer in remission.      PLAN:  1. TIA 2/2 L VA occlusion vs dissection:  a. Denies residual vertigo/dizziness, nausea or headache.  Able to do full cervical ROM without symptoms b. Discussed precautions with possible vertebral artery dissection.  She was released to return to driving with restrictions such as avoiding main roads or highways and to travel close to her home.  Discussed avoiding quick rapid head movements as this may induce symptoms.   c. Plan on repeating CTA neck 3 months post stroke for follow-up imaging d. Cardiac monitor recently completed and she plans on sending back to cardiology today.  Discussed indication for cardiac monitor and to rule out atrial fibrillation as potential etiology e. Released to return to aquatic therapy and dry needling for lower back/hip issues only. Avoid dry needling for left shoulder/neck pain until follow up imaging completed.  Letter provided for therapist as requested f. Continue aspirin 81 mg daily and clopidogrel 75 mg daily  and atorvastatin 80mg  daily  for  secondary stroke prevention.  Continue DAPT for additional 2 months and then aspirin alone. g. Discussed secondary stroke prevention measures and importance of close PCP follow up for aggressive stroke risk factor management  2. Hx of protein S deficiency: Recent protein S level 52.  Prior level 37 11 years ago. Will repeat lab work and refer to hematology if indicated. Hx of DVT after ankle fracture 11 years ago 3. HTN: BP goal <130/90.  Stable.  Continue f/u with PCP 4. HLD: LDL goal <70. Recent LDL 191.  Recently initiated atorvastatin 80 mg daily.  Advised to continue and repeat lipid panel with PCP in the next 1 to 2 months.  Ongoing prescribing deferred to PCP 5. DMII: A1c goal<7.0. Recent A1c 6.6.  Diet controlled currently.    Follow up in 3 months or call earlier if needed   I spent a prolonged 60 minutes of face-to-face and non-face-to-face time with patient.  This included previsit chart review, lab review, study review, order entry, electronic health record documentation, patient education regarding recent TIA and answered numerous questions regarding vertebral artery dissection, importance of managing stroke risk factors and answered all questions to patient satisfaction   Frann Rider, AGNP-BC  [facility] [address] [place], [place] [address]  Phone [phone] Fax [phone] Note: This document was prepared with digital dictation and possible smart phrase technology. Any transcriptional errors that result from this process are unintentional.

## 2019-12-13 LAB — PROTEIN S, TOTAL: Protein S Ag, Total: 88 % (ref 60–150)

## 2019-12-13 LAB — PROTEIN S ACTIVITY: Protein S Activity: 42 % — ABNORMAL LOW (ref 63–140)

## 2019-12-16 ENCOUNTER — Telehealth: Payer: Self-pay | Admitting: Adult Health

## 2019-12-16 NOTE — Telephone Encounter (Signed)
[employer] medicare order sent to GI. No auth they will reach out to the patient to schedule.  

## 2019-12-17 NOTE — Progress Notes (Signed)
Kindly inform the patient that one of her clotting tests is slightly abnormal which is not a new finding and not significantly changed from previous results.  Nothing to worry about

## 2019-12-18 ENCOUNTER — Telehealth: Payer: Self-pay | Admitting: Adult Health

## 2019-12-18 ENCOUNTER — Telehealth: Payer: Self-pay

## 2019-12-18 NOTE — Telephone Encounter (Signed)
Suzanne Baldwin Suzanne Baldwin Suzanne Baldwin has called for a referral for pt's right hip.  Pt is scheduled for an evaluation on tomorrow. Suzanne Baldwin provided their contact info as fax [phone] ph [phone]

## 2019-12-18 NOTE — Telephone Encounter (Signed)
I called and spoke to [place].  I relayed that we did not refer pt to her.  It was a prior therapy order, but pt was cleared to go back to resume prior therapy for her lower/hip issues from when pt as last in to see [place] 12-11-19.  Pt on phone and was told that she may keep appt for tomorrow with therapy (originally ordered by Dr. Tamera Punt or Dr. Mayer Camel.

## 2019-12-18 NOTE — Telephone Encounter (Signed)
Are you ok to do referral for the this pt and her R hip issue?  I see from last note spoke about returning to aqua therapy for hip??

## 2019-12-18 NOTE — Telephone Encounter (Signed)
She was previously participating in aquatic therapy prior to her stroke and she question clearance to return to aquatic therapy which she was cleared to do.  If she needs an additional order or referral, this will need to be obtained by orthopedics.

## 2019-12-18 NOTE — Telephone Encounter (Signed)
Garvin Fila, MD  12/17/2019 8:38 AM EDT Back to Top    Kindly inform the patient that one of her clotting tests is slightly abnormal which is not a new finding and not significantly changed from previous results. Nothing to worry about   I called pt. No answer, left a message asking pt to call me back.

## 2019-12-19 ENCOUNTER — Other Ambulatory Visit: Payer: Self-pay | Admitting: Cardiology

## 2019-12-19 DIAGNOSIS — R002 Palpitations: Secondary | ICD-10-CM

## 2019-12-19 DIAGNOSIS — I4891 Unspecified atrial fibrillation: Secondary | ICD-10-CM

## 2019-12-19 DIAGNOSIS — G459 Transient cerebral ischemic attack, unspecified: Secondary | ICD-10-CM

## 2019-12-19 DIAGNOSIS — R42 Dizziness and giddiness: Secondary | ICD-10-CM

## 2019-12-23 ENCOUNTER — Telehealth: Payer: Self-pay | Admitting: *Deleted

## 2019-12-23 NOTE — Telephone Encounter (Addendum)
Called patient and LVM to advise per Dr Leonie Man: one of her clotting tests is slightly abnormal which is not a new finding and not significantly changed from previous results. There is nothing to worry about in lab results, good news. Left # for questions.

## 2019-12-24 ENCOUNTER — Ambulatory Visit
Admission: RE | Admit: 2019-12-24 | Discharge: 2019-12-24 | Disposition: A | Discharge status: Home / Self Care | Payer: Medicare Other | Source: Ambulatory Visit | Attending: Adult Health | Admitting: Adult Health

## 2019-12-24 DIAGNOSIS — I7774 Dissection of vertebral artery: Secondary | ICD-10-CM

## 2019-12-24 MED ORDER — IOPAMIDOL (ISOVUE-370) INJECTION 76%
75.0000 mL | Freq: Once | INTRAVENOUS | Status: AC | PRN
  Administered 2019-12-24: 75 mL via INTRAVENOUS

## 2019-12-26 ENCOUNTER — Telehealth: Payer: Self-pay | Admitting: *Deleted

## 2019-12-26 NOTE — Telephone Encounter (Signed)
-----   Message from Frann Rider, NP sent at 12/24/2019  3:57 PM EDT ----- Please advise patient that repeat imaging show continued possible vertebral artery dissection which is not surprising as it has only been 1 month since prior imaging.  It was requested that imaging be completed around December.  Please have patient call office in the beginning of December for repeat imaging.  Thank you.

## 2019-12-26 NOTE — Telephone Encounter (Signed)
Spoke to pt who was out walking.  I relayed that per JM/NP repeat imaging showed continued possible vertebral dissection, not surprising since her prior img was one month ago.  Recommended repeat in December.  Pt to call early December to get ordered then scheduled.  She verbalized understanding.

## 2020-01-27 ENCOUNTER — Inpatient Hospital Stay: Payer: Self-pay | Admitting: Neurology

## 2020-02-06 ENCOUNTER — Telehealth: Payer: Self-pay | Admitting: Adult Health

## 2020-02-06 DIAGNOSIS — I7774 Dissection of vertebral artery: Secondary | ICD-10-CM

## 2020-02-06 NOTE — Telephone Encounter (Signed)
I called the pt and let her know that Suzanne Billow NP did order a repeat CTA neck. Pt aware that any insurance auth needed will be completed and then she will receive a call to schedule. She verbalized understanding and appreciation for the call.

## 2020-02-06 NOTE — Telephone Encounter (Signed)
Patient is calling and wanting to Know if Janett Billow Is still wanting me to have a CT in December ?

## 2020-02-06 NOTE — Telephone Encounter (Signed)
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  Originally planned on repeating this month with orders placed in 11/2019 requesting to be completed 01/2020 but unfortunately completed in October.  As this date was too early to adequately assess resolution, repeat CTA neck indicated at this time.

## 2020-02-10 ENCOUNTER — Telehealth: Payer: Self-pay | Admitting: Adult Health

## 2020-02-10 NOTE — Progress Notes (Signed)
Kindly inform the patient that heart monitoring study did not reveal any evidence of atrial fibrillation or any other worrisome

## 2020-02-10 NOTE — Telephone Encounter (Signed)
[employer] medicare order sent to GI. No auth they will reach out to the patient to schedule.  

## 2020-02-12 ENCOUNTER — Telehealth: Payer: Self-pay | Admitting: Emergency Medicine

## 2020-02-12 NOTE — Telephone Encounter (Signed)
Called and left message (as per DPR) and informed patient of Dr. Clydene Fake review of her Cardiac Monitor study results.  Left office number for patient to call back if she had any questions.

## 2020-02-12 NOTE — Telephone Encounter (Signed)
-----   Message from Garvin Fila, MD sent at 02/10/2020  4:29 PM EST ----- Kindly inform the patient that heart monitoring study did not reveal any evidence of atrial fibrillation or any other worrisome

## 2020-02-17 ENCOUNTER — Ambulatory Visit
Admission: RE | Admit: 2020-02-17 | Discharge: 2020-02-17 | Disposition: A | Discharge status: Home / Self Care | Payer: Medicare Other | Source: Ambulatory Visit | Attending: Adult Health | Admitting: Adult Health

## 2020-02-17 MED ORDER — IOPAMIDOL (ISOVUE-370) INJECTION 76%
75.0000 mL | Freq: Once | INTRAVENOUS | Status: AC | PRN
  Administered 2020-02-17: 15:00:00 75 mL via INTRAVENOUS

## 2020-02-19 ENCOUNTER — Telehealth: Payer: Self-pay | Admitting: *Deleted

## 2020-02-19 NOTE — Telephone Encounter (Signed)
Called pt and LVM (ok per DPR) with detailed message as noted below by Janett Billow NP. I left the office number in the message and requested a courtesy call to let us know she received this message.   I reached out to the daughter and spoke with the patient. We discussed the CT angio results per Janett Billow NP and the plan for pt to remain on aspirin alone. Pt stated she had completed 90 days of plavix. Her questions were answered. She is aware she has a follow up in February. She verbalized appreciation for the call.

## 2020-02-19 NOTE — Telephone Encounter (Signed)
-----   Message from Frann Rider, NP sent at 02/19/2020 12:42 PM EST ----- Please advise patient that recent imaging of her vertebral artery was similar compared to prior imaging.  Please advise her that at times these occlusions can become chronic and her body will compensate for this.  If she has any reoccurring symptoms or new stroke/TIA symptoms, she should call 911 immediately for further evaluation.  At this time, she should have completed Plavix and remains on aspirin alone.  Thank you.

## 2020-02-24 NOTE — Telephone Encounter (Signed)
No need for IR referral.  Aspirin Plavix for 3 months followed by single agent alone and continue aggressive risk factor modification

## 2020-04-09 ENCOUNTER — Ambulatory Visit: Payer: Medicare Other | Admitting: Adult Health

## 2020-04-09 ENCOUNTER — Encounter: Payer: Self-pay | Admitting: Adult Health

## 2020-04-09 VITALS — BP 131/83 | HR 77 | Ht 68.0 in | Wt 210.0 lb

## 2020-04-09 DIAGNOSIS — I6502 Occlusion and stenosis of left vertebral artery: Secondary | ICD-10-CM

## 2020-04-09 NOTE — Patient Instructions (Signed)
Continue aspirin 81 mg daily  and atorvastatin 80 mg daily  for secondary stroke prevention  We will plan on repeating imaging after our follow-up visit in 6 months  Continue to follow up with PCP regarding cholesterol and blood pressure management  Maintain strict control of hypertension with blood pressure goal below 130/90 and cholesterol with LDL cholesterol (bad cholesterol) goal below 70 mg/dL.       Followup in the future with me in 6 months or call earlier if needed       Thank you for coming to see [place] at [facility]. I hope we have been able to provide you high quality care today.  You may receive a patient satisfaction survey over the next few weeks. We would appreciate your feedback and comments so that we may continue to improve ourselves and the health of our patients.

## 2020-04-09 NOTE — Progress Notes (Signed)
I agree with the above plan 

## 2020-04-09 NOTE — Progress Notes (Signed)
[facility] [address] [place] [address] [phone]       STROKE FOLLOW UP NOTE  [name] Date of Birth:  1955/10/21 Medical Record Number:  [id number]   Reason for Referral:  TIA follow up    SUBJECTIVE:   CHIEF COMPLAINT:  Chief Complaint  Patient presents with  . Follow-up    RM 14 alone  Pt is well     HPI:   Today, 04/09/2020, Suzanne [place] returns for 64-month TIA follow-up.  She has been stable from stroke standpoint without new or reoccurring stroke/TIA symptoms.  She remains on aspirin 81 mg daily and atorvastatin without side effects.  Blood pressure today 131/83.  Repeat CTA 01/2020 showed continued L VA occlusion.  Cardiac monitor negative for atrial fibrillation.  She has been routinely exercising and modifying diet.  No concerns at this time.   History provided for reference purposes only Initial visit 12/11/2019 JM: [name] is being seen for hospital follow-up. She has been doing well since discharge but reports she still doesn't feel completely back to normal. Denies continued vertigo/dizziness, headache, numbness, or nausea.  Denies these symptoms with any type of head movement or position.  She has been doing PT/OT at home and completed PT yesterday. She hasn't returned back to all prior activities yet and is wondering if she is able to return back to driving, aquatic therapy and dry needling treatment for right piriformis muscle and left shoulder pain. denies new or worsening stroke/TIA symptoms.  Continues on aspirin and Plavix without bleeding or bruising.  Continues on atorvastatin without myalgias.  Blood pressure today 127/80.  Completed cardiac monitor 2 days ago but only able to complete 3 weeks due to adhesive irritation.  She has multiple questions today regarding etiology with possible causes, possible future imaging to assess vertebral artery and restrictions.  No further concerns at this time.   Stroke  admission 11/16/2019 Suzanne [place] is a 65 y.o. female with history of diabetes mellitus, hyperlipidemia, hypertension, sickle cell trait, hx of a clotting disorder, and breast cancer in remission  who presented on 11/16/2019 with intermittent episodes of vertigo for 2 weeks followed by generalized weakness, nausea, increasing fatigue, tachycardia,  palpitations, and a left-sided throbbing headache with neck pain. 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   Suspicion for dissection as there was no other evidence of atherosclerosis.  MRI negative for acute abnormality and likely TIA due to L VA occlusion vs dissection and recommended ruling out occult A. fib or clotting disorder.  Recommended DAPT for 3 months and then aspirin alone.  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  History of palpitations and recommended 30-day cardiac event monitor outpatient to rule out A. fib.  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  HTN stable.  LDL 191 and increase atorvastatin from 10 mg to 80 mg daily.  Controlled DM with A1c 6.6.  Other stroke risk factors include advanced age, obesity and family history of stroke.  No personal history of stroke.  Other active problems include asymmetric enlargement of left palatine tonsil and recommended ENT follow-up outpatient as well as hypokalemia.  Evaluated by therapy and recommended discharge home with [facility] PT and discharged home  in stable condition.  TIA due to Left vertebral ? occlusion ? Dissection - need to rule out occult afib or clotting disorder  MRI head - No acute intracranial abnormality. Multifocal hyperintense T2-weighted signal within the white matter, most commonly chronic ischemic  microangiopathy.   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 The Left V4 segment and PICA are reconstituted intracranially.   2D Echo - EF 60-65%. No source of embolus   LE dopplers no DVT  Lacey Jensen Virus 2 - negative  UDS neg  LDL - 191  HgbA1c - 6.6  VTE prophylaxis - SCDs  No antithrombotic prior to admission, now on aspirin 81 mg daily and plavix DAPT for 3 months and then ASA alone  Patient counseled to be compliant with her antithrombotic medications  Ongoing aggressive stroke risk factor management  Therapy recommendations:  HH PT  Disposition:  home     ROS:   14 system review of systems performed and negative with exception of those listed in HPI  PMH:  Past Medical History:  Diagnosis Date  . Allergy   . Arthritis   . Breast disorder    breast cancer 25 years ago  . Cancer ([place])   . Clotting disorder ([place])   . Diabetes mellitus without complication ([place])   . Glaucoma   . Hyperlipidemia   . Hypertension   . Sickle cell trait ([facility])     PSH:  Past Surgical History:  Procedure Laterality Date  . CESAREAN SECTION    . COLONOSCOPY    . COSMETIC SURGERY    . FRACTURE SURGERY    . POLYPECTOMY    . ROTATOR CUFF REPAIR    . TUBAL LIGATION      Social History:  Social History   Socioeconomic History  . Marital status: Single    Spouse name: Not on file  . Number of children: Not on file  . Years of education: Not on file  . Highest education level: Not on file  Occupational History  . Not on file  Tobacco Use  . Smoking status: Never Smoker  . Smokeless tobacco: Never Used  Substance and Sexual Activity  . Alcohol use: No  . Drug use: No  . Sexual activity: Never  Other Topics Concern  . Not on file  Social History Narrative  . Not on file   Social Determinants of Health   Financial Resource Strain: Not on file  Food Insecurity: Not on file  Transportation Needs:  Not on file  Physical Activity: Not on file  Stress: Not on file  Social Connections: Not on file  Intimate Partner Violence: Not on file    Family History:  Family History  Problem Relation Age of Onset  . Diabetes Father   . Hypertension Father   . Stroke Father   . Diabetes Mother   . Hypertension Mother   . Breast cancer Mother   . Cancer Mother   . Heart disease Maternal Grandmother   . Colon cancer Neg Hx   . Esophageal cancer Neg Hx   . Stomach cancer Neg Hx   . Rectal cancer Neg Hx     Medications:   Current Outpatient Medications on File Prior to Visit  Medication Sig Dispense Refill  . aspirin EC 81 MG EC tablet Take 1 tablet (81 mg total) by mouth daily. Swallow whole. 30 tablet 11  . atorvastatin (LIPITOR) 80 MG tablet Take  1 tablet (80 mg total) by mouth daily. 30 tablet 0  . brimonidine (ALPHAGAN) 0.2 % ophthalmic solution Place 1 drop into both eyes 3 (three) times daily.    . brimonidine-timolol (COMBIGAN) 0.2-0.5 % ophthalmic solution Place 1 drop into both eyes 2 (two) times daily.     . Cholecalciferol (VITAMIN D-3) 125 MCG (5000 UT) TABS Take 5,000 Units by mouth daily.    . Fluocinolone Acetonide Body 0.01 % OIL Apply 1 application topically every Monday, Wednesday, and Friday. Apply to thin patches on scalp    . latanoprost (XALATAN) 0.005 % ophthalmic solution Place 1 drop into both eyes at bedtime.    Marland Kitchen nystatin cream (MYCOSTATIN) Apply 1 application topically See admin instructions. Apply externally to genital area twice daily (mix with triamcinolone cream)    . timolol (TIMOPTIC) 0.5 % ophthalmic solution Place 1 drop into both eyes daily.    Marland Kitchen triamcinolone cream (KENALOG) 0.1 % Apply 1 application topically See admin instructions. Apply externally to genital area twice daily (mix with nystatin cream)     No current facility-administered medications on file prior to visit.    Allergies:  No Known Allergies    OBJECTIVE:  Physical  Exam  Vitals:   04/09/20 0946  BP: 131/83  Pulse: 77  Weight: 210 lb (95.3 kg)  Height: 5\' 8"  (1.727 m)   Body mass index is 31.93 kg/m. No exam data present   General: well developed, well nourished, very pleasant middle-age African-American female, seated, in no evident distress Head: head normocephalic and atraumatic.   Neck: supple with no carotid or supraclavicular bruits Cardiovascular: regular rate and rhythm, no murmurs Musculoskeletal: no deformity Skin:  no rash/petichiae Vascular:  Normal pulses all extremities   Neurologic Exam Mental Status: Awake and fully alert.   Fluent speech and language.  Oriented to place and time. Recent and remote memory intact. Attention span, concentration and fund of knowledge appropriate. Mood and affect appropriate.  Cranial Nerves: Pupils equal, briskly reactive to light. Extraocular movements full without nystagmus. Visual fields full to confrontation. Hearing intact. Facial sensation intact. Face, tongue, palate moves normally and symmetrically.  Motor: Normal bulk and tone. Normal strength in all tested extremity muscles. Sensory.: intact to touch , pinprick , position and vibratory sensation.  Coordination: Rapid alternating movements normal in all extremities. Finger-to-nose and heel-to-shin performed accurately bilaterally. Gait and Station: Arises from chair without difficulty. Stance is normal. Gait demonstrates normal stride length and balance without use of assistive device.  Able to tandem walk and heel toe without difficulty.  Romberg negative. Reflexes: 1+ and symmetric. Toes downgoing.          ASSESSMENT: Takasha Vetere is a 65 y.o. year old female presented with intermittent episodes of vertigo for 2 weeks followed by generalized weakness, nausea, increasing fatigue, tachycardia, palpitations and a left-sided throbbing headache with neck pain on 11/16/2019 likely TIA due to left VA occlusion vs dissection secondary to  possibly occult A. fib or clotting disorder. Vascular risk factors include HTN, HLD, DM, sickle cell trait, hx of clotting disorder, hx of DVT, and breast cancer in remission.      PLAN:  1. TIA 2/2 L VA occlusion vs dissection:  a. Stable without new or recurring stroke/TIA symptoms b. Repeat CTA neck 01/2020 showed similar occlusion of L VA -discussed possibility of chronic occlusion and will plan on repeating in 6 months c. Cardiac monitor negative for atrial fibrillation d. Continue aspirin 81 mg daily  and atorvastatin  80mg  daily  for secondary stroke prevention.   e. Discussed secondary stroke prevention measures and importance of close PCP follow up for aggressive stroke risk factor management  2. HTN: BP goal <130/90.  Well-controlled monitor by PCP. 3. HLD: LDL goal <70.  Prior LDL 191.  On atorvastatin 80 mg daily.  She is not currently fasting today therefore request follow-up with PCP in the next 1 to 2 months for repeat lipid panel as well as ongoing prescribing of atorvastatin 4. DMII: A1c goal<7.0.  Prior A1c 6.6.  Diet controlled currently.    Follow up in 6 months or call earlier if needed  CC:  [facility] provider: Dr. Barron Alvine, Dola Factor, MD    I spent 30 minutes of face-to-face and non-face-to-face time with patient.  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  [facility] [address] [place], [place] [address]  Phone [phone] Fax [phone] Note: This document was prepared with digital dictation and possible smart phrase technology. Any transcriptional errors that result from this process are unintentional.

## 2020-07-22 ENCOUNTER — Emergency Department (HOSPITAL_COMMUNITY): Payer: Medicare Other

## 2020-07-22 ENCOUNTER — Other Ambulatory Visit: Payer: Self-pay

## 2020-07-22 ENCOUNTER — Emergency Department (HOSPITAL_COMMUNITY)
Admission: EM | Admit: 2020-07-22 | Discharge: 2020-07-23 | Disposition: A | Discharge status: Home / Self Care | Payer: Medicare Other | Attending: Emergency Medicine | Admitting: Emergency Medicine

## 2020-07-22 ENCOUNTER — Encounter (HOSPITAL_COMMUNITY): Payer: Self-pay | Admitting: Emergency Medicine

## 2020-07-22 DIAGNOSIS — R42 Dizziness and giddiness: Secondary | ICD-10-CM | POA: Insufficient documentation

## 2020-07-22 DIAGNOSIS — E119 Type 2 diabetes mellitus without complications: Secondary | ICD-10-CM | POA: Insufficient documentation

## 2020-07-22 DIAGNOSIS — Z7982 Long term (current) use of aspirin: Secondary | ICD-10-CM | POA: Diagnosis not present

## 2020-07-22 DIAGNOSIS — R110 Nausea: Secondary | ICD-10-CM | POA: Insufficient documentation

## 2020-07-22 DIAGNOSIS — I10 Essential (primary) hypertension: Secondary | ICD-10-CM | POA: Diagnosis not present

## 2020-07-22 DIAGNOSIS — Z853 Personal history of malignant neoplasm of breast: Secondary | ICD-10-CM | POA: Diagnosis not present

## 2020-07-22 LAB — DIFFERENTIAL
Abs Immature Granulocytes: 0.02 10*3/uL (ref 0.00–0.07)
Basophils Absolute: 0 10*3/uL (ref 0.0–0.1)
Basophils Relative: 1 %
Eosinophils Absolute: 0.1 10*3/uL (ref 0.0–0.5)
Eosinophils Relative: 2 %
Immature Granulocytes: 0 %
Lymphocytes Relative: 34 %
Lymphs Abs: 2.4 10*3/uL (ref 0.7–4.0)
Monocytes Absolute: 0.6 10*3/uL (ref 0.1–1.0)
Monocytes Relative: 8 %
Neutro Abs: 3.9 10*3/uL (ref 1.7–7.7)
Neutrophils Relative %: 55 %

## 2020-07-22 LAB — I-STAT CHEM 8, ED
BUN: 8 mg/dL (ref 8–23)
Calcium, Ion: 1.15 mmol/L (ref 1.15–1.40)
Chloride: 105 mmol/L (ref 98–111)
Creatinine, Ser: 0.7 mg/dL (ref 0.44–1.00)
Glucose, Bld: 124 mg/dL — ABNORMAL HIGH (ref 70–99)
HCT: 45 % (ref 36.0–46.0)
Hemoglobin: 15.3 g/dL — ABNORMAL HIGH (ref 12.0–15.0)
Potassium: 4 mmol/L (ref 3.5–5.1)
Sodium: 141 mmol/L (ref 135–145)
TCO2: 25 mmol/L (ref 22–32)

## 2020-07-22 LAB — COMPREHENSIVE METABOLIC PANEL
ALT: 11 U/L (ref 0–44)
AST: 19 U/L (ref 15–41)
Albumin: 3.8 g/dL (ref 3.5–5.0)
Alkaline Phosphatase: 90 U/L (ref 38–126)
Anion gap: 9 (ref 5–15)
BUN: 8 mg/dL (ref 8–23)
CO2: 25 mmol/L (ref 22–32)
Calcium: 8.9 mg/dL (ref 8.9–10.3)
Chloride: 101 mmol/L (ref 98–111)
Creatinine, Ser: 0.78 mg/dL (ref 0.44–1.00)
GFR, Estimated: >60 mL/min (ref >60)
Glucose, Bld: 126 mg/dL — ABNORMAL HIGH (ref 70–99)
Potassium: 3.7 mmol/L (ref 3.5–5.1)
Sodium: 135 mmol/L (ref 135–145)
Total Bilirubin: 0.7 mg/dL (ref 0.3–1.2)
Total Protein: 8 g/dL (ref 6.5–8.1)

## 2020-07-22 LAB — PROTIME-INR
INR: 1 (ref 0.8–1.2)
Prothrombin Time: 13 seconds (ref 11.4–15.2)

## 2020-07-22 LAB — CBG MONITORING, ED
Glucose-Capillary: 103 mg/dL — ABNORMAL HIGH (ref 70–99)
Glucose-Capillary: 119 mg/dL — ABNORMAL HIGH (ref 70–99)

## 2020-07-22 LAB — CBC
HCT: 44.4 % (ref 36.0–46.0)
Hemoglobin: 14.4 g/dL (ref 12.0–15.0)
MCH: 27.4 pg (ref 26.0–34.0)
MCHC: 32.4 g/dL (ref 30.0–36.0)
MCV: 84.4 fL (ref 80.0–100.0)
Platelets: 329 10*3/uL (ref 150–400)
RBC: 5.26 MIL/uL — ABNORMAL HIGH (ref 3.87–5.11)
RDW: 14.4 % (ref 11.5–15.5)
WBC: 7.1 10*3/uL (ref 4.0–10.5)
nRBC: 0 % (ref 0.0–0.2)

## 2020-07-22 LAB — APTT: aPTT: 27 seconds (ref 24–36)

## 2020-07-22 MED ORDER — SODIUM CHLORIDE 0.9% FLUSH
3.0000 mL | Freq: Once | INTRAVENOUS | Status: AC
Start: 2020-07-22 — End: 2020-07-23
  Administered 2020-07-23: 3 mL via INTRAVENOUS

## 2020-07-22 NOTE — ED Triage Notes (Signed)
Pt reports "I'm having a TIA... the same symptoms as last time."  Pt c/o "not feeling well all day."  Reports the she started experiencing dizziness since last night, along w/ nausea and "not feeling like myself."  Pt is very tearful and anxious  No neuro deficits at this time.   Also reports he has been "peeing all day, at least 9 times and it is very urgent."

## 2020-07-23 ENCOUNTER — Emergency Department (HOSPITAL_COMMUNITY): Payer: Medicare Other

## 2020-07-23 ENCOUNTER — Encounter (HOSPITAL_COMMUNITY): Payer: Self-pay | Admitting: Emergency Medicine

## 2020-07-23 LAB — URINALYSIS, ROUTINE W REFLEX MICROSCOPIC
Bilirubin Urine: NEGATIVE
Glucose, UA: NEGATIVE mg/dL
Hgb urine dipstick: NEGATIVE
Ketones, ur: NEGATIVE mg/dL
Leukocytes,Ua: NEGATIVE
Nitrite: NEGATIVE
Protein, ur: NEGATIVE mg/dL
Specific Gravity, Urine: 1.013 (ref 1.005–1.030)
pH: 5 (ref 5.0–8.0)

## 2020-07-23 IMAGING — MR MR HEAD W/O CM
10 of 11 series · 43 of 48 positions shown · IV contrast (gadavist)
Comparison: Brain MRI [DATE].  Neck CTA [DATE]

CLINICAL DATA: Peripheral vertigo. Dizziness since last night with
nausea

EXAM:
MRI HEAD WITHOUT CONTRAST
MRA HEAD WITHOUT CONTRAST
MRA OF THE NECK WITHOUT AND WITH CONTRAST
TECHNIQUE: Multiplanar, multi-echo pulse sequences of the brain and surrounding
structures were acquired without intravenous contrast. Angiographic
images of the Circle of Willis were acquired using MRA technique
without intravenous contrast. Angiographic images of the neck were
acquired using MRA technique without and with intravenous contrast.
Carotid stenosis measurements (when applicable) are obtained
utilizing NASCET criteria, using the distal internal carotid
diameter as the denominator.
CONTRAST:  9mL GADAVIST GADOBUTROL 1 MMOL/ML IV SOLN

[Series 5: DWI · axial · 3.0mm · 0.88mm/px · z∈[-97,+50]mm · 10 of 100 slices shown (1 of 4)]
[im 1/100]
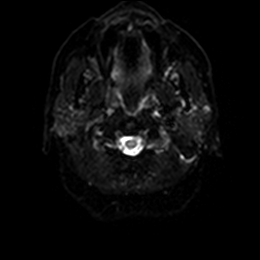
[im 12/100]
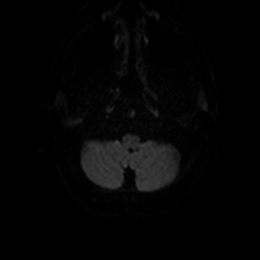
[im 23/100]
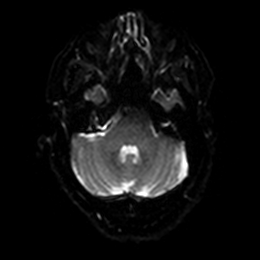
[im 34/100]
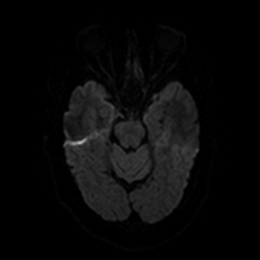
[im 45/100]
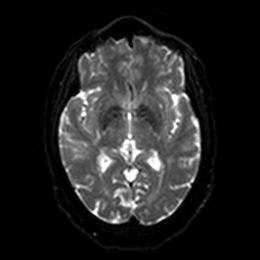
[im 56/100]
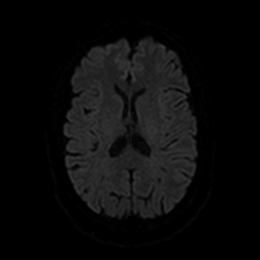
[im 67/100]
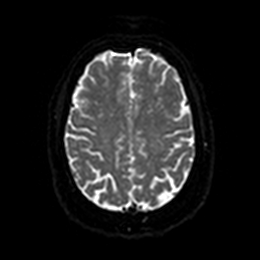
[im 78/100]
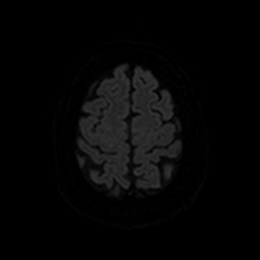
[im 89/100]
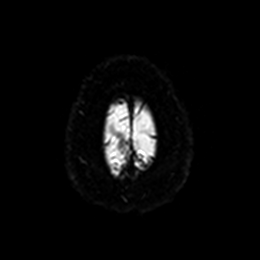
[im 100/100]
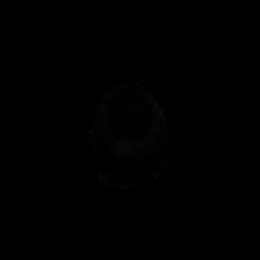

[Series 6: DWI · axial · 3.0mm · 0.88mm/px · z∈[-97,+50]mm · 5 of 50 slices shown (2 of 4)]
[im 1/50]
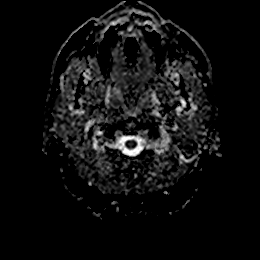
[im 13/50]
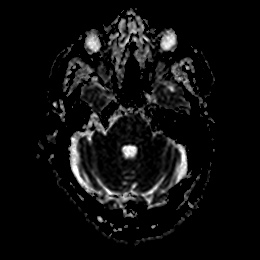
[im 25/50]
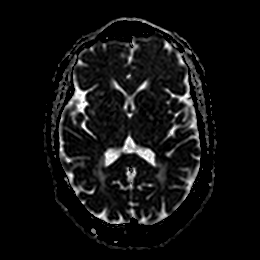
[im 37/50]
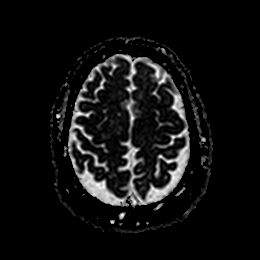
[im 50/50]
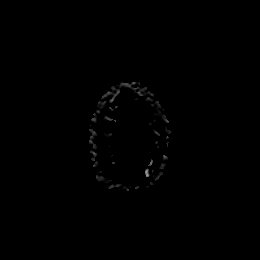

[Series 7: DWI · coronal · 4.0mm · 0.88mm/px · 6 of 64 slices shown (3 of 4)]
[im 1/64]
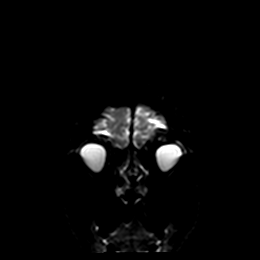
[im 13/64]
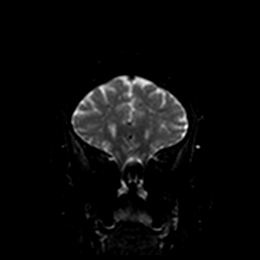
[im 26/64]
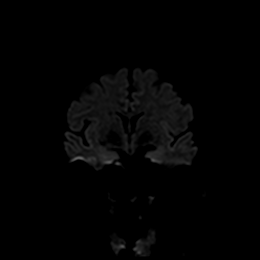
[im 38/64]
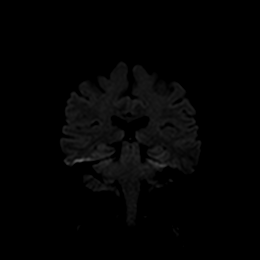
[im 51/64]
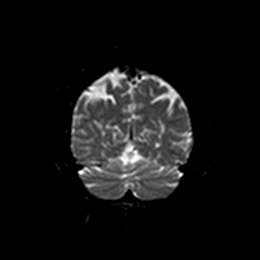
[im 64/64]
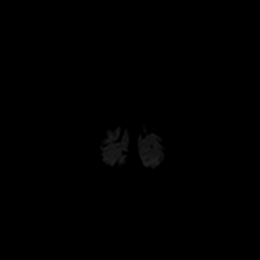

[Series 8: DWI · coronal · 4.0mm · 0.88mm/px · 3 of 32 slices shown (4 of 4)]
[im 1/32]
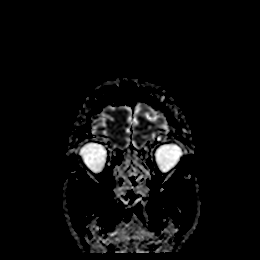
[im 16/32]
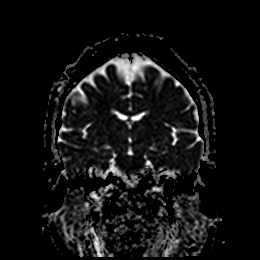
[im 32/32]
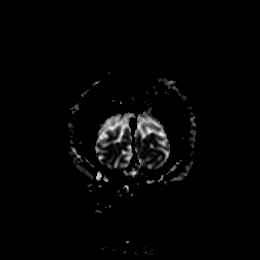

[Series 9: T1 · sagittal · 5.0mm · 0.75mm/px · 2 of 23 slices shown]
[im 1/23]
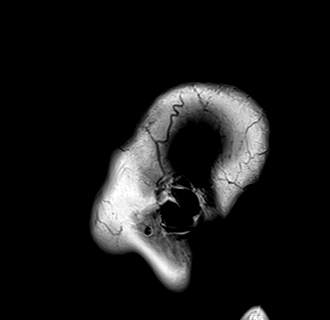
[im 23/23]
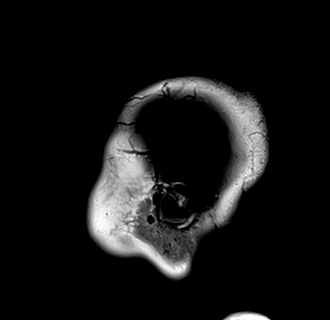

[Series 10: T2 · axial · 5.0mm · 0.72mm/px · z∈[-94,+49]mm · 2 of 25 slices shown (1 of 2)]
[im 1/25]
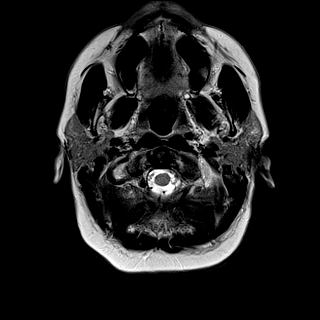
[im 25/25]
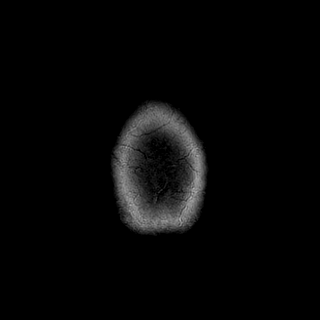

[Series 11: FLAIR · axial · 5.0mm · 0.45mm/px · z∈[-95,+48]mm · 2 of 25 slices shown]
[im 1/25]
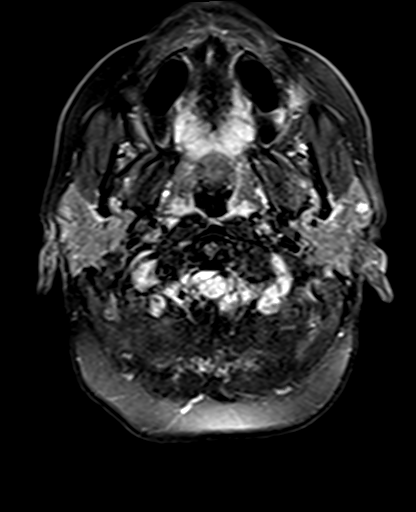
[im 25/25]
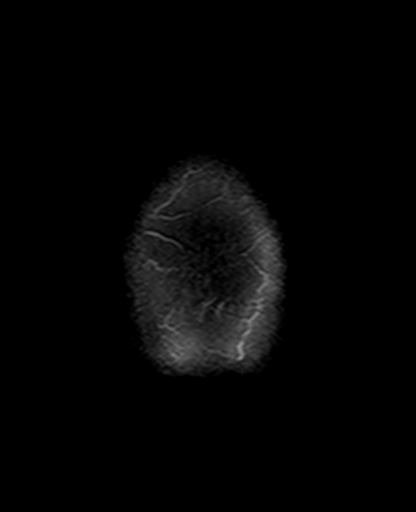

[Series 13: pha_images · axial · 3.0mm · 0.90mm/px · z∈[-112,+64]mm · 5 of 60 slices shown]
[im 1/60]
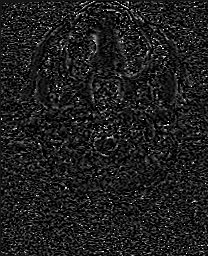
[im 15/60]
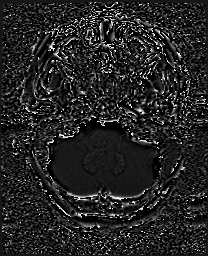
[im 30/60]
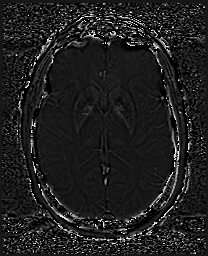
[im 45/60]
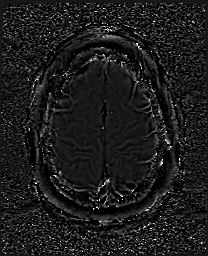
[im 60/60]
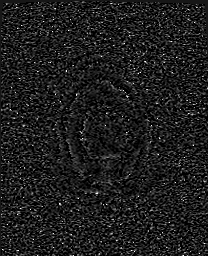

[Series 14: swi_images · axial · 3.0mm · 0.90mm/px · z∈[-112,+64]mm · 5 of 60 slices shown]
[im 1/60]
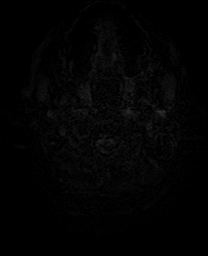
[im 15/60]
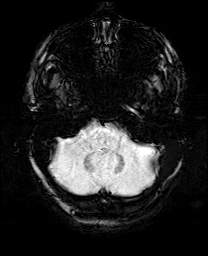
[im 30/60]
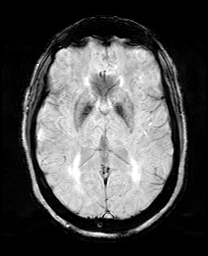
[im 45/60]
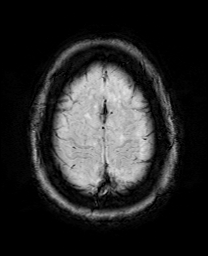
[im 60/60]
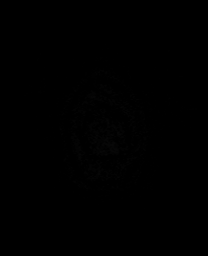

[Series 17: T2 · coronal · 5.0mm · 0.34mm/px · 3 of 29 slices shown (2 of 2)]
[im 1/29]
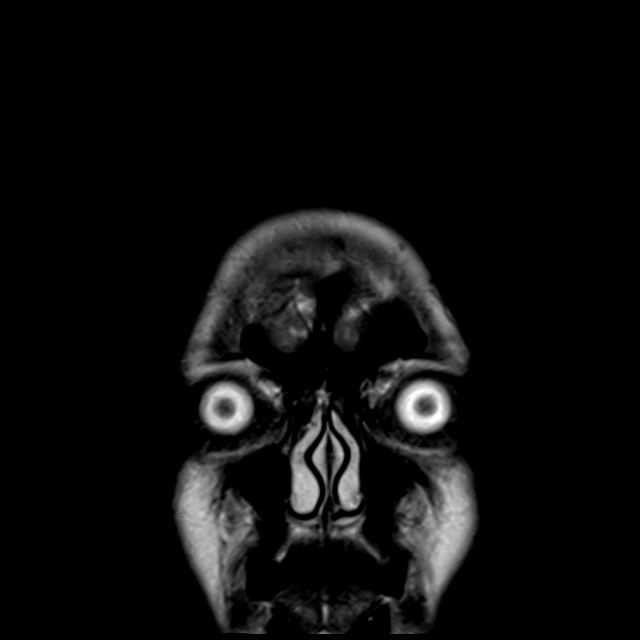
[im 15/29]
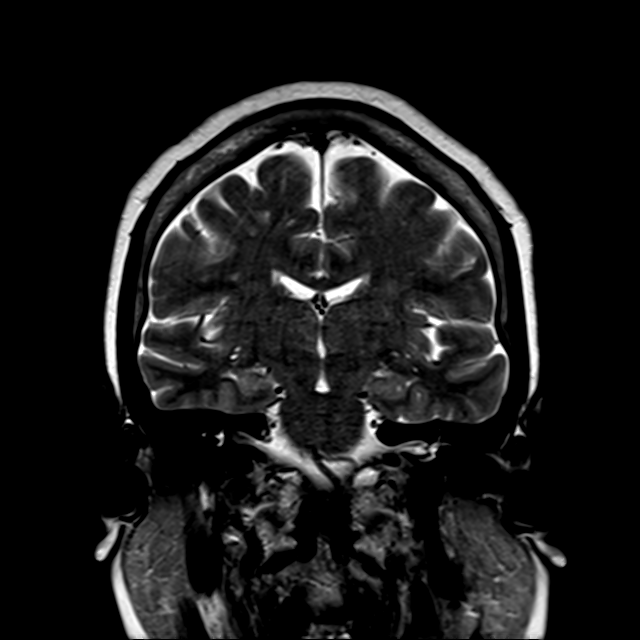
[im 29/29]
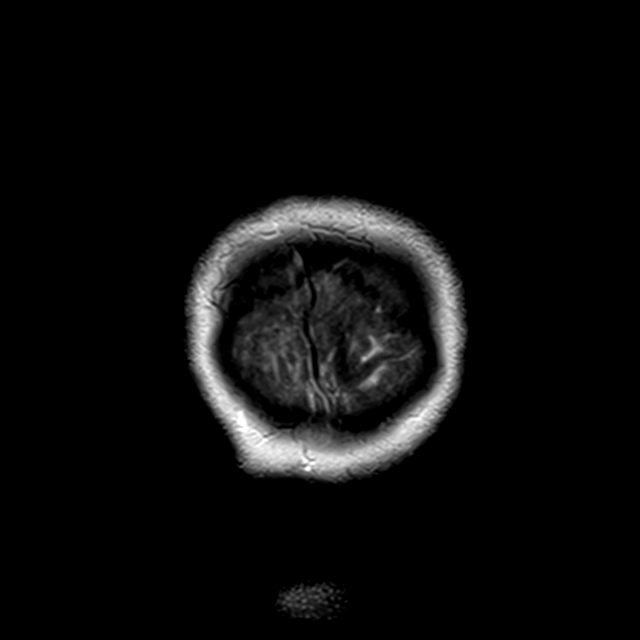

[43 of 48 positions shown; findings below may reference images not displayed]

FINDINGS: MR HEAD FINDINGS

Brain: No acute infarction, hemorrhage, hydrocephalus, extra-axial
collection or mass lesion. FLAIR hyperintensity in the cerebral
white matter to a moderate degree, most consistent with chronic
small vessel ischemia given medical history. Brain volume is normal.

Vascular: See below

Skull and upper cervical spine: Normal marrow signal.

Sinuses/Orbits: Negative

MRA HEAD FINDINGS

Anterior circulation: Hypoplastic right A1 segment. Fetal type left
PCA. No branch occlusion, beading, or flow limiting stenosis of
major vessels. Negative for aneurysm

Posterior circulation: Fetal type left PCA. No branch occlusion,
beading, or flow limiting stenosis of major vessels. Negative for
aneurysm

Anatomic variants:As above

MRA NECK FINDINGS

Aortic arch: Normal with 3 vessel branching.

Right carotid system: Tortuosity without notable atheromatous
irregularity or stenosis.

Left carotid system: Tortuosity without notable atheromatous
irregularity or stenosis.

Vertebral arteries: No proximal subclavian stenosis. Chronic
occlusion of the left vertebral artery with V4 segment
reconstitution. There is history of vertebral dissection based on
[XA] CTA.

Other: None.
IMPRESSION: Brain MRI:

1. No acute finding including infarct.
2. Moderate chronic small vessel ischemia.

Intracranial MRA:

Negative.

Neck MRA:

Chronic left vertebral occlusion. No stenosis or irregularity the
other major vessels.

## 2020-07-23 MED ORDER — MECLIZINE HCL 25 MG PO TABS
25.0000 mg | ORAL_TABLET | Freq: Once | ORAL | Status: AC
  Administered 2020-07-23: 25 mg via ORAL
  Filled 2020-07-23: qty 1

## 2020-07-23 MED ORDER — MECLIZINE HCL 12.5 MG PO TABS: 12.5000 mg | ORAL_TABLET | Freq: Three times a day (TID) | ORAL | 0 refills | Status: AC | PRN

## 2020-07-23 MED ORDER — GADOBUTROL 1 MMOL/ML IV SOLN
9.0000 mL | Freq: Once | INTRAVENOUS | Status: AC | PRN
  Administered 2020-07-23: 9 mL via INTRAVENOUS

## 2020-07-23 NOTE — ED Provider Notes (Signed)
[facility] EMERGENCY DEPARTMENT Provider Note   CSN: [id number] Arrival date & time: 07/22/20  2145     History Chief Complaint  Patient presents with  . Dizziness    Suzanne Baldwin is a 65 y.o. female.  The history is provided by the patient.  Dizziness Quality:  Head spinning and room spinning Severity:  Severe Onset quality:  Gradual Duration:  1 day Timing:  Constant Progression:  Waxing and waning Chronicity:  New Context: head movement   Relieved by:  Nothing Worsened by:  Nothing Ineffective treatments:  None tried Associated symptoms: nausea   Associated symptoms: no blood in stool, no chest pain, no diarrhea, no headaches, no palpitations, no shortness of breath, no syncope, no tinnitus, no vision changes, no vomiting and no weakness   Risk factors: no Meniere's disease        Past Medical History:  Diagnosis Date  . Allergy   . Arthritis   . Breast disorder    breast cancer 25 years ago  . Cancer ([place])   . Clotting disorder ([place])   . Diabetes mellitus without complication ([place])   . Glaucoma   . Hyperlipidemia   . Hypertension   . Sickle cell trait [facility])     Patient Active Problem List   Diagnosis Date Noted  . TIA (transient ischemic attack) 11/17/2019  . Diabetes ([place]) 11/17/2019  . 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 NAUSEA 03/25/2010  . PREHYPERTENSION 03/25/2010  . POSTMENOPAUSAL STATUS 11/20/2009  . FOOT PAIN, LEFT 09/23/2009  . OBESITY 08/04/2009  . DEEP VENOUS THROMBOPHLEBITIS, LEG, RIGHT 12/23/2008  . FRACTURE, ANKLE, RIGHT 10/29/2008  . TRICHOMONIASIS 03/06/2007  . HYPERCHOLESTEROLEMIA 03/06/2007  . DERMATOPHYTOSIS, NAIL 02/01/2007  . ALLERGIC RHINITIS 02/01/2007  . NEOPLASM, MALIGNANT, BREAST, HX OF 02/01/2007    Past Surgical History:  Procedure Laterality Date  . CESAREAN SECTION    . COLONOSCOPY    . COSMETIC SURGERY    . FRACTURE SURGERY    . POLYPECTOMY    . ROTATOR CUFF REPAIR    .  TUBAL LIGATION       OB History    Gravida  5   Para      Term      Preterm      AB  2   Living  3     SAB      IAB      Ectopic      Multiple      Live Births              Family History  Problem Relation Age of Onset  . Diabetes Father   . Hypertension Father   . Stroke Father   . Diabetes Mother   . Hypertension Mother   . Breast cancer Mother   . Cancer Mother   . Heart disease Maternal Grandmother   . Colon cancer Neg Hx   . Esophageal cancer Neg Hx   . Stomach cancer Neg Hx   . Rectal cancer Neg Hx     Social History   Tobacco Use  . Smoking status: Never Smoker  . Smokeless tobacco: Never Used  Substance Use Topics  . Alcohol use: No  . Drug use: No    Home Medications Prior to Admission medications   Medication Sig Start Date End Date Taking? Authorizing Provider  aspirin EC 81 MG EC tablet Take 1 tablet (81 mg total) by mouth daily. Swallow whole. 11/19/19  Yes Jeralyn Bennett,  MD  atorvastatin (LIPITOR) 80 MG tablet Take 1 tablet (80 mg total) by mouth daily. Patient taking differently: Take 80 mg by mouth at bedtime. 11/19/19 12/19/19 Yes Jeralyn Bennett, MD  brimonidine (ALPHAGAN) 0.2 % ophthalmic solution Place 1 drop into both eyes 3 (three) times daily. 11/08/19  Yes [provider]  Cholecalciferol (VITAMIN D3 GUMMIES PO) Take 1 tablet by mouth daily.   Yes [provider]  ELDERBERRY PO Take 3 tablets by mouth daily. gummy   Yes [provider]  finasteride (PROSCAR) 5 MG tablet Take 2.5 mg by mouth at bedtime. 06/20/20  Yes [provider]  Fluocinolone Acetonide Body 0.01 % OIL Apply 1 application topically every Monday, Wednesday, and Friday. Apply to thin patches on scalp 07/04/19  Yes [provider]  latanoprost (XALATAN) 0.005 % ophthalmic solution Place 1 drop into both eyes at bedtime. 10/25/19  Yes [provider]  levocetirizine (XYZAL) 5 MG tablet Take 5 mg by mouth  every evening.   Yes [provider]  OVER THE COUNTER MEDICATION Place 1 drop into both eyes daily as needed (dry eyes). Replenish eye drops   Yes [provider]  timolol (TIMOPTIC) 0.5 % ophthalmic solution Place 1 drop into both eyes daily. 11/08/19  Yes [provider]    Allergies    Patient has no known allergies.  Review of Systems   Review of Systems  Constitutional: Negative for diaphoresis and fever.  HENT: Negative for tinnitus.   Eyes: Negative for redness.  Respiratory: Negative for chest tightness and shortness of breath.   Cardiovascular: Negative for chest pain, palpitations, leg swelling and syncope.  Gastrointestinal: Positive for nausea. Negative for blood in stool, diarrhea and vomiting.  Genitourinary: Negative for difficulty urinating.  Musculoskeletal: Negative for arthralgias, back pain and neck pain.  Neurological: Positive for dizziness. Negative for facial asymmetry, speech difficulty, weakness, light-headedness and headaches.  All other systems reviewed and are negative.   Physical Exam Updated Vital Signs BP (!) 138/93   Pulse 62   Temp 97.9 F (36.6 C) (Oral)   Resp 12   Ht 5\' 8"  (1.727 m)   Wt 92.5 kg   SpO2 100%   BMI 31.02 kg/m   Physical Exam Vitals and nursing note reviewed.  Constitutional:      General: She is not in acute distress.    Appearance: Normal appearance.  HENT:     Head: Normocephalic and atraumatic.     Nose: Nose normal.     Mouth/Throat:     Mouth: Mucous membranes are moist.     Pharynx: Oropharynx is clear.  Eyes:     Extraocular Movements: Extraocular movements intact.     Conjunctiva/sclera: Conjunctivae normal.     Pupils: Pupils are equal, round, and reactive to light.  Cardiovascular:     Rate and Rhythm: Normal rate and regular rhythm.     Pulses: Normal pulses.     Heart sounds: Normal heart sounds.  Pulmonary:     Effort: Pulmonary effort is normal.     Breath sounds:  Normal breath sounds.  Abdominal:     General: Abdomen is flat. Bowel sounds are normal.     Palpations: Abdomen is soft.     Tenderness: There is no abdominal tenderness. There is no guarding.  Musculoskeletal:     Cervical back: Normal range of motion and neck supple.  Skin:    General: Skin is warm and dry.     Capillary Refill:  Capillary refill takes less than 2 seconds.  Neurological:     General: No focal deficit present.     Mental Status: She is alert and oriented to person, place, and time.     Cranial Nerves: No cranial nerve deficit.     Deep Tendon Reflexes: Reflexes normal.  Psychiatric:        Mood and Affect: Mood normal.        Behavior: Behavior normal.     ED Results / Procedures / Treatments   Labs (all labs ordered are listed, but only abnormal results are displayed) Results for orders placed or performed during the hospital encounter of 07/22/20  Protime-INR  Result Value Ref Range   Prothrombin Time 13.0 11.4 - 15.2 seconds   INR 1.0 0.8 - 1.2  APTT  Result Value Ref Range   aPTT 27 24 - 36 seconds  CBC  Result Value Ref Range   WBC 7.1 4.0 - 10.5 K/uL   RBC 5.26 (H) 3.87 - 5.11 MIL/uL   Hemoglobin 14.4 12.0 - 15.0 g/dL   HCT 44.4 36.0 - 46.0 %   MCV 84.4 80.0 - 100.0 fL   MCH 27.4 26.0 - 34.0 pg   MCHC 32.4 30.0 - 36.0 g/dL   RDW 14.4 11.5 - 15.5 %   Platelets 329 150 - 400 K/uL   nRBC 0.0 0.0 - 0.2 %  Differential  Result Value Ref Range   Neutrophils Relative % 55 %   Neutro Abs 3.9 1.7 - 7.7 K/uL   Lymphocytes Relative 34 %   Lymphs Abs 2.4 0.7 - 4.0 K/uL   Monocytes Relative 8 %   Monocytes Absolute 0.6 0.1 - 1.0 K/uL   Eosinophils Relative 2 %   Eosinophils Absolute 0.1 0.0 - 0.5 K/uL   Basophils Relative 1 %   Basophils Absolute 0.0 0.0 - 0.1 K/uL   Immature Granulocytes 0 %   Abs Immature Granulocytes 0.02 0.00 - 0.07 K/uL  Comprehensive metabolic panel  Result Value Ref Range   Sodium 135 135 - 145 mmol/L   Potassium 3.7 3.5  - 5.1 mmol/L   Chloride 101 98 - 111 mmol/L   CO2 25 22 - 32 mmol/L   Glucose, Bld 126 (H) 70 - 99 mg/dL   BUN 8 8 - 23 mg/dL   Creatinine, Ser 0.78 0.44 - 1.00 mg/dL   Calcium 8.9 8.9 - 10.3 mg/dL   Total Protein 8.0 6.5 - 8.1 g/dL   Albumin 3.8 3.5 - 5.0 g/dL   AST 19 15 - 41 U/L   ALT 11 0 - 44 U/L   Alkaline Phosphatase 90 38 - 126 U/L   Total Bilirubin 0.7 0.3 - 1.2 mg/dL   GFR, Estimated >60 >60 mL/min   Anion gap 9 5 - 15  Urinalysis, Routine w reflex microscopic Urine, Clean Catch  Result Value Ref Range   Color, Urine YELLOW YELLOW   APPearance CLEAR CLEAR   Specific Gravity, Urine 1.013 1.005 - 1.030   pH 5.0 5.0 - 8.0   Glucose, UA NEGATIVE NEGATIVE mg/dL   Hgb urine dipstick NEGATIVE NEGATIVE   Bilirubin Urine NEGATIVE NEGATIVE   Ketones, ur NEGATIVE NEGATIVE mg/dL   Protein, ur NEGATIVE NEGATIVE mg/dL   Nitrite NEGATIVE NEGATIVE   Leukocytes,Ua NEGATIVE NEGATIVE  CBG monitoring, ED  Result Value Ref Range   Glucose-Capillary 119 (H) 70 - 99 mg/dL  I-stat chem 8, ED  Result Value Ref Range   Sodium 141 135 -  145 mmol/L   Potassium 4.0 3.5 - 5.1 mmol/L   Chloride 105 98 - 111 mmol/L   BUN 8 8 - 23 mg/dL   Creatinine, Ser 0.70 0.44 - 1.00 mg/dL   Glucose, Bld 124 (H) 70 - 99 mg/dL   Calcium, Ion 1.15 1.15 - 1.40 mmol/L   TCO2 25 22 - 32 mmol/L   Hemoglobin 15.3 (H) 12.0 - 15.0 g/dL   HCT 45.0 36.0 - 46.0 %  CBG monitoring, ED  Result Value Ref Range   Glucose-Capillary 103 (H) 70 - 99 mg/dL   CT HEAD WO CONTRAST  Result Date: 07/22/2020 CLINICAL DATA:  65 year old female with head trauma. EXAM: CT HEAD WITHOUT CONTRAST TECHNIQUE: Contiguous axial images were obtained from the base of the skull through the vertex without intravenous contrast. COMPARISON:  Brain MRI dated 11/17/2019. FINDINGS: Brain: Mild age-related atrophy and chronic microvascular ischemic changes. There is no acute intracranial hemorrhage. No mass effect or midline shift. No extra-axial  fluid collection. Vascular: No hyperdense vessel or unexpected calcification. Skull: Normal. Negative for fracture or focal lesion. Sinuses/Orbits: Mild mucoperiosteal thickening of paranasal sinuses. No air-fluid. The mastoid air cells are clear. Other: None IMPRESSION: 1. No acute intracranial pathology. 2. Mild age-related atrophy and chronic microvascular ischemic changes. Electronically Signed   By: Anner Crete M.D.   On: 07/22/2020 23:13   MR ANGIO HEAD WO CONTRAST  Result Date: 07/23/2020 CLINICAL DATA:  Peripheral vertigo. Dizziness since last night with nausea EXAM: MRI HEAD WITHOUT CONTRAST MRA HEAD WITHOUT CONTRAST MRA OF THE NECK WITHOUT AND WITH CONTRAST TECHNIQUE: Multiplanar, multi-echo pulse sequences of the brain and surrounding structures were acquired without intravenous contrast. Angiographic images of the Circle of Willis were acquired using MRA technique without intravenous contrast. Angiographic images of the neck were acquired using MRA technique without and with intravenous contrast. Carotid stenosis measurements (when applicable) are obtained utilizing NASCET criteria, using the distal internal carotid diameter as the denominator. CONTRAST:  49mL GADAVIST GADOBUTROL 1 MMOL/ML IV SOLN COMPARISON:  Brain MRI 11/17/2019.  Neck CTA 02/17/2020 FINDINGS: MR HEAD FINDINGS Brain: No acute infarction, hemorrhage, hydrocephalus, extra-axial collection or mass lesion. FLAIR hyperintensity in the cerebral white matter to a moderate degree, most consistent with chronic small vessel ischemia given medical history. Brain volume is normal. Vascular: See below Skull and upper cervical spine: Normal marrow signal. Sinuses/Orbits: Negative MRA HEAD FINDINGS Anterior circulation: Hypoplastic right A1 segment. Fetal type left PCA. No branch occlusion, beading, or flow limiting stenosis of major vessels. Negative for aneurysm Posterior circulation: Fetal type left PCA. No branch occlusion, beading, or  flow limiting stenosis of major vessels. Negative for aneurysm Anatomic variants:As above MRA NECK FINDINGS Aortic arch: Normal with 3 vessel branching. Right carotid system: Tortuosity without notable atheromatous irregularity or stenosis. Left carotid system: Tortuosity without notable atheromatous irregularity or stenosis. Vertebral arteries: No proximal subclavian stenosis. 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 Other: None. IMPRESSION: Brain MRI: 1. No acute finding including infarct. 2. Moderate chronic small vessel ischemia. Intracranial MRA: Negative. Neck MRA: Chronic left vertebral occlusion. No stenosis or irregularity the other major vessels. Electronically Signed   By: Monte Fantasia M.D.   On: 07/23/2020 04:36   MR Angiogram Neck W or Wo Contrast  Result Date: 07/23/2020 CLINICAL DATA:  Peripheral vertigo. Dizziness since last night with nausea EXAM: MRI HEAD WITHOUT CONTRAST MRA HEAD WITHOUT CONTRAST MRA OF THE NECK  WITHOUT AND WITH CONTRAST TECHNIQUE: Multiplanar, multi-echo pulse sequences of the brain and surrounding structures were acquired without intravenous contrast. Angiographic images of the Circle of Willis were acquired using MRA technique without intravenous contrast. Angiographic images of the neck were acquired using MRA technique without and with intravenous contrast. Carotid stenosis measurements (when applicable) are obtained utilizing NASCET criteria, using the distal internal carotid diameter as the denominator. CONTRAST:  58mL GADAVIST GADOBUTROL 1 MMOL/ML IV SOLN COMPARISON:  Brain MRI 11/17/2019.  Neck CTA 02/17/2020 FINDINGS: MR HEAD FINDINGS Brain: No acute infarction, hemorrhage, hydrocephalus, extra-axial collection or mass lesion. FLAIR hyperintensity in the cerebral white matter to a moderate degree, most consistent with chronic small vessel ischemia given medical  history. Brain volume is normal. Vascular: See below Skull and upper cervical spine: Normal marrow signal. Sinuses/Orbits: Negative MRA HEAD FINDINGS Anterior circulation: Hypoplastic right A1 segment. Fetal type left PCA. No branch occlusion, beading, or flow limiting stenosis of major vessels. Negative for aneurysm Posterior circulation: Fetal type left PCA. No branch occlusion, beading, or flow limiting stenosis of major vessels. Negative for aneurysm Anatomic variants:As above MRA NECK FINDINGS Aortic arch: Normal with 3 vessel branching. Right carotid system: Tortuosity without notable atheromatous irregularity or stenosis. Left carotid system: Tortuosity without notable atheromatous irregularity or stenosis. Vertebral arteries: No proximal subclavian stenosis. 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 Other: None. IMPRESSION: Brain MRI: 1. No acute finding including infarct. 2. Moderate chronic small vessel ischemia. Intracranial MRA: Negative. Neck MRA: Chronic left vertebral occlusion. No stenosis or irregularity the other major vessels. Electronically Signed   By: Monte Fantasia M.D.   On: 07/23/2020 04:36   MR BRAIN WO CONTRAST  Result Date: 07/23/2020 CLINICAL DATA:  Peripheral vertigo. Dizziness since last night with nausea EXAM: MRI HEAD WITHOUT CONTRAST MRA HEAD WITHOUT CONTRAST MRA OF THE NECK WITHOUT AND WITH CONTRAST TECHNIQUE: Multiplanar, multi-echo pulse sequences of the brain and surrounding structures were acquired without intravenous contrast. Angiographic images of the Circle of Willis were acquired using MRA technique without intravenous contrast. Angiographic images of the neck were acquired using MRA technique without and with intravenous contrast. Carotid stenosis measurements (when applicable) are obtained utilizing NASCET criteria, using the distal internal carotid diameter as the  denominator. CONTRAST:  8mL GADAVIST GADOBUTROL 1 MMOL/ML IV SOLN COMPARISON:  Brain MRI 11/17/2019.  Neck CTA 02/17/2020 FINDINGS: MR HEAD FINDINGS Brain: No acute infarction, hemorrhage, hydrocephalus, extra-axial collection or mass lesion. FLAIR hyperintensity in the cerebral white matter to a moderate degree, most consistent with chronic small vessel ischemia given medical history. Brain volume is normal. Vascular: See below Skull and upper cervical spine: Normal marrow signal. Sinuses/Orbits: Negative MRA HEAD FINDINGS Anterior circulation: Hypoplastic right A1 segment. Fetal type left PCA. No branch occlusion, beading, or flow limiting stenosis of major vessels. Negative for aneurysm Posterior circulation: Fetal type left PCA. No branch occlusion, beading, or flow limiting stenosis of major vessels. Negative for aneurysm Anatomic variants:As above MRA NECK FINDINGS Aortic arch: Normal with 3 vessel branching. Right carotid system: Tortuosity without notable atheromatous irregularity or stenosis. Left carotid system: Tortuosity without notable atheromatous irregularity or stenosis. Vertebral arteries: No proximal subclavian stenosis. 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 Other: None. IMPRESSION: Brain MRI: 1. No acute finding including infarct. 2. Moderate chronic small vessel ischemia. Intracranial MRA: Negative. Neck MRA:  Chronic left vertebral occlusion. No stenosis or irregularity the other major vessels. Electronically Signed   By: Monte Fantasia M.D.   On: 07/23/2020 04:36    EKG None  Radiology CT HEAD WO CONTRAST  Result Date: 07/22/2020 CLINICAL DATA:  65 year old female with head trauma. EXAM: CT HEAD WITHOUT CONTRAST TECHNIQUE: Contiguous axial images were obtained from the base of the skull through the vertex without intravenous contrast. COMPARISON:  Brain MRI dated 11/17/2019. FINDINGS:  Brain: Mild age-related atrophy and chronic microvascular ischemic changes. There is no acute intracranial hemorrhage. No mass effect or midline shift. No extra-axial fluid collection. Vascular: No hyperdense vessel or unexpected calcification. Skull: Normal. Negative for fracture or focal lesion. Sinuses/Orbits: Mild mucoperiosteal thickening of paranasal sinuses. No air-fluid. The mastoid air cells are clear. Other: None IMPRESSION: 1. No acute intracranial pathology. 2. Mild age-related atrophy and chronic microvascular ischemic changes. Electronically Signed   By: Anner Crete M.D.   On: 07/22/2020 23:13   MR ANGIO HEAD WO CONTRAST  Result Date: 07/23/2020 CLINICAL DATA:  Peripheral vertigo. Dizziness since last night with nausea EXAM: MRI HEAD WITHOUT CONTRAST MRA HEAD WITHOUT CONTRAST MRA OF THE NECK WITHOUT AND WITH CONTRAST TECHNIQUE: Multiplanar, multi-echo pulse sequences of the brain and surrounding structures were acquired without intravenous contrast. Angiographic images of the Circle of Willis were acquired using MRA technique without intravenous contrast. Angiographic images of the neck were acquired using MRA technique without and with intravenous contrast. Carotid stenosis measurements (when applicable) are obtained utilizing NASCET criteria, using the distal internal carotid diameter as the denominator. CONTRAST:  79mL GADAVIST GADOBUTROL 1 MMOL/ML IV SOLN COMPARISON:  Brain MRI 11/17/2019.  Neck CTA 02/17/2020 FINDINGS: MR HEAD FINDINGS Brain: No acute infarction, hemorrhage, hydrocephalus, extra-axial collection or mass lesion. FLAIR hyperintensity in the cerebral white matter to a moderate degree, most consistent with chronic small vessel ischemia given medical history. Brain volume is normal. Vascular: See below Skull and upper cervical spine: Normal marrow signal. Sinuses/Orbits: Negative MRA HEAD FINDINGS Anterior circulation: Hypoplastic right A1 segment. Fetal type left PCA. No  branch occlusion, beading, or flow limiting stenosis of major vessels. Negative for aneurysm Posterior circulation: Fetal type left PCA. No branch occlusion, beading, or flow limiting stenosis of major vessels. Negative for aneurysm Anatomic variants:As above MRA NECK FINDINGS Aortic arch: Normal with 3 vessel branching. Right carotid system: Tortuosity without notable atheromatous irregularity or stenosis. Left carotid system: Tortuosity without notable atheromatous irregularity or stenosis. Vertebral arteries: No proximal subclavian stenosis. 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 Other: None. IMPRESSION: Brain MRI: 1. No acute finding including infarct. 2. Moderate chronic small vessel ischemia. Intracranial MRA: Negative. Neck MRA: Chronic left vertebral occlusion. No stenosis or irregularity the other major vessels. Electronically Signed   By: Monte Fantasia M.D.   On: 07/23/2020 04:36   MR Angiogram Neck W or Wo Contrast  Result Date: 07/23/2020 CLINICAL DATA:  Peripheral vertigo. Dizziness since last night with nausea EXAM: MRI HEAD WITHOUT CONTRAST MRA HEAD WITHOUT CONTRAST MRA OF THE NECK WITHOUT AND WITH CONTRAST TECHNIQUE: Multiplanar, multi-echo pulse sequences of the brain and surrounding structures were acquired without intravenous contrast. Angiographic images of the Circle of Willis were acquired using MRA technique without intravenous contrast. Angiographic images of the neck were acquired using MRA technique without and with intravenous contrast. Carotid stenosis measurements (when applicable) are obtained utilizing NASCET criteria, using the distal internal carotid diameter  as the denominator. CONTRAST:  42mL GADAVIST GADOBUTROL 1 MMOL/ML IV SOLN COMPARISON:  Brain MRI 11/17/2019.  Neck CTA 02/17/2020 FINDINGS: MR HEAD FINDINGS Brain: No acute infarction, hemorrhage, hydrocephalus, extra-axial  collection or mass lesion. FLAIR hyperintensity in the cerebral white matter to a moderate degree, most consistent with chronic small vessel ischemia given medical history. Brain volume is normal. Vascular: See below Skull and upper cervical spine: Normal marrow signal. Sinuses/Orbits: Negative MRA HEAD FINDINGS Anterior circulation: Hypoplastic right A1 segment. Fetal type left PCA. No branch occlusion, beading, or flow limiting stenosis of major vessels. Negative for aneurysm Posterior circulation: Fetal type left PCA. No branch occlusion, beading, or flow limiting stenosis of major vessels. Negative for aneurysm Anatomic variants:As above MRA NECK FINDINGS Aortic arch: Normal with 3 vessel branching. Right carotid system: Tortuosity without notable atheromatous irregularity or stenosis. Left carotid system: Tortuosity without notable atheromatous irregularity or stenosis. Vertebral arteries: No proximal subclavian stenosis. 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 Other: None. IMPRESSION: Brain MRI: 1. No acute finding including infarct. 2. Moderate chronic small vessel ischemia. Intracranial MRA: Negative. Neck MRA: Chronic left vertebral occlusion. No stenosis or irregularity the other major vessels. Electronically Signed   By: Monte Fantasia M.D.   On: 07/23/2020 04:36   MR BRAIN WO CONTRAST  Result Date: 07/23/2020 CLINICAL DATA:  Peripheral vertigo. Dizziness since last night with nausea EXAM: MRI HEAD WITHOUT CONTRAST MRA HEAD WITHOUT CONTRAST MRA OF THE NECK WITHOUT AND WITH CONTRAST TECHNIQUE: Multiplanar, multi-echo pulse sequences of the brain and surrounding structures were acquired without intravenous contrast. Angiographic images of the Circle of Willis were acquired using MRA technique without intravenous contrast. Angiographic images of the neck were acquired using MRA technique without and with  intravenous contrast. Carotid stenosis measurements (when applicable) are obtained utilizing NASCET criteria, using the distal internal carotid diameter as the denominator. CONTRAST:  32mL GADAVIST GADOBUTROL 1 MMOL/ML IV SOLN COMPARISON:  Brain MRI 11/17/2019.  Neck CTA 02/17/2020 FINDINGS: MR HEAD FINDINGS Brain: No acute infarction, hemorrhage, hydrocephalus, extra-axial collection or mass lesion. FLAIR hyperintensity in the cerebral white matter to a moderate degree, most consistent with chronic small vessel ischemia given medical history. Brain volume is normal. Vascular: See below Skull and upper cervical spine: Normal marrow signal. Sinuses/Orbits: Negative MRA HEAD FINDINGS Anterior circulation: Hypoplastic right A1 segment. Fetal type left PCA. No branch occlusion, beading, or flow limiting stenosis of major vessels. Negative for aneurysm Posterior circulation: Fetal type left PCA. No branch occlusion, beading, or flow limiting stenosis of major vessels. Negative for aneurysm Anatomic variants:As above MRA NECK FINDINGS Aortic arch: Normal with 3 vessel branching. Right carotid system: Tortuosity without notable atheromatous irregularity or stenosis. Left carotid system: Tortuosity without notable atheromatous irregularity or stenosis. Vertebral arteries: No proximal subclavian stenosis. 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 Other: None. IMPRESSION: Brain MRI: 1. No acute finding including infarct. 2. Moderate chronic small vessel ischemia. Intracranial MRA: Negative. Neck MRA: Chronic left vertebral occlusion. No stenosis or irregularity the other major vessels. Electronically Signed   By: Monte Fantasia M.D.   On: 07/23/2020 04:36    Procedures Procedures   Medications Ordered in ED Medications  meclizine (ANTIVERT) tablet 25 mg (has no administration in time range)  sodium chloride flush (NS) 0.9  % injection 3 mL (3 mLs Intravenous Given 07/23/20  0041)  gadobutrol (GADAVIST) 1 MMOL/ML injection 9 mL (9 mLs Intravenous Contrast Given 07/23/20 0407)    ED Course  I have reviewed the triage vital signs and the nursing notes.  Pertinent labs & imaging results that were available during my care of the patient were reviewed by me and considered in my medical decision making (see chart for details).   1135 case d/w Dr. Lorrin Goodell of neuro, MRA or the head and neck with MRI, if negative may be discharged home Symptoms consistent with peripheral vertigo.  No acute findings on MRI/MRA.  Stable for discharge with close follow up.  Will refer to neurology for ongoing care and patient's PMD.    Suzanne Baldwin was evaluated in Emergency Department on 07/23/2020 for the symptoms described in the history of present illness. She was evaluated in the context of the global COVID-19 pandemic, which necessitated consideration that the patient might be at risk for infection with the SARS-CoV-2 virus that causes COVID-19. Institutional protocols and algorithms that pertain to the evaluation of patients at risk for COVID-19 are in a state of rapid change based on information released by regulatory bodies including the CDC and federal and state organizations. These policies and algorithms were followed during the patient's care in the ED.  Final Clinical Impression(s) / ED Diagnoses Return for intractable cough, coughing up blood, fevers >100.4 unrelieved by medication, shortness of breath, intractable vomiting, chest pain, shortness of breath, weakness, numbness, changes in speech, facial asymmetry, abdominal pain, passing out, Inability to tolerate liquids or food, cough, altered mental status or any concerns. No signs of systemic illness or infection. The patient is nontoxic-appearing on exam and vital signs are within normal limits.  I have reviewed the triage vital signs and the nursing notes. Pertinent labs &  imaging results that were available during my care of the patient were reviewed by me and considered in my medical decision making (see chart for details). After history, exam, and medical workup I feel the patient has been appropriately medically screened and is safe for discharge home. Pertinent diagnoses were discussed with the patient. Patient was given return precautions.     Armel Rabbani, MD 07/23/20 [phone]

## 2020-07-23 NOTE — ED Notes (Signed)
Patient verbalized understanding of discharge instructions. Opportunity for questions and answers.  

## 2020-07-23 NOTE — ED Notes (Signed)
Patient transported to MRI 

## 2020-10-08 ENCOUNTER — Ambulatory Visit: Payer: Medicare Other | Admitting: Adult Health

## 2020-10-08 ENCOUNTER — Encounter: Payer: Self-pay | Admitting: Adult Health

## 2020-10-08 VITALS — BP 137/88 | HR 67 | Ht 68.0 in | Wt 213.0 lb

## 2020-10-08 DIAGNOSIS — I6502 Occlusion and stenosis of left vertebral artery: Secondary | ICD-10-CM | POA: Diagnosis not present

## 2020-10-08 DIAGNOSIS — R42 Dizziness and giddiness: Secondary | ICD-10-CM | POA: Diagnosis not present

## 2020-10-08 DIAGNOSIS — I10 Essential (primary) hypertension: Secondary | ICD-10-CM

## 2020-10-08 DIAGNOSIS — E785 Hyperlipidemia, unspecified: Secondary | ICD-10-CM | POA: Diagnosis not present

## 2020-10-08 DIAGNOSIS — E119 Type 2 diabetes mellitus without complications: Secondary | ICD-10-CM

## 2020-10-08 MED ORDER — ROSUVASTATIN CALCIUM 40 MG PO TABS: 40.0000 mg | ORAL_TABLET | Freq: Every day | ORAL | 5 refills | Status: AC

## 2020-10-08 NOTE — Patient Instructions (Addendum)
Continue aspirin 81 mg daily  and start Crestor 40 mg daily for secondary stroke prevention  Continue to follow up with PCP regarding cholesterol and blood pressure management  Maintain strict control of hypertension with blood pressure goal below 130/90 and cholesterol with LDL cholesterol (bad cholesterol) goal below 70 mg/dL.   Your vertigo symptoms appear to be consistent with benign positional vertigo (BPPV).  The symptoms can come and go and at times can be treated with medications to take as needed (such as meclizine) or if persistent, vestibular rehab is usually beneficial. Please continue to monitor for symptoms and follow up with audiology as scheduled for further evaluation     Followup in the future with me in 6 months or call earlier if needed       Thank you for coming to see [place] at [facility]. I hope we have been able to provide you high quality care today.  You may receive a patient satisfaction survey over the next few weeks. We would appreciate your feedback and comments so that we may continue to improve ourselves and the health of our patients.   Benign Positional Vertigo Vertigo is the feeling that you or your surroundings are moving when they are not. Benign positional vertigo is the most common form of vertigo. This is usually a harmless condition (benign). This condition is positional. This means that symptoms are triggered bycertain movements and positions. This condition can be dangerous if it occurs while you are doing something that could cause harm to yourself or others. This includes activities such asdriving or operating machinery. What are the causes? The inner ear has fluid-filled canals that help your brain sense movement and balance. When the fluid moves, the brain receives messages about your body'sposition. With benign positional vertigo, calcium crystals in the inner ear break free and disturb the inner ear area. This causes your  brain to receive confusingmessages about your body's position. What increases the risk? You are more likely to develop this condition if: You are a woman. You are 33 years of age or older. You have recently had a head injury. You have an inner ear disease. What are the signs or symptoms? Symptoms of this condition usually happen when you move your head or your eyes in different directions. Symptoms may start suddenly and usually last for less than a minute. They include: Loss of balance and falling. Feeling like you are spinning or moving. Feeling like your surroundings are spinning or moving. Nausea and vomiting. Blurred vision. Dizziness. Involuntary eye movement (nystagmus). Symptoms can be mild and cause only minor problems, or they can be severe and interfere with daily life. Episodes of benign positional vertigo may return (recur) over time. Symptoms may also improve over time. How is this diagnosed? This condition may be diagnosed based on: Your medical history. A physical exam of the head, neck, and ears. Positional tests to check for or stimulate vertigo. You may be asked to turn your head and change positions, such as going from sitting to lying down. A health care provider will watch for symptoms of vertigo. You may be referred to a health care provider who specializes in ear, nose, and throat problems (ENT or otolaryngologist) or a provider who specializes in disorders of the nervous system (neurologist). How is this treated?  This condition may be treated in a session in which your health care provider moves your head in specific positions to help the displaced crystals in your inner ear move.  Treatment for this condition may take several sessions. Surgerymay be needed in severe cases, but this is rare. In some cases, benign positional vertigo may resolve on its own in 2-4 weeks. Follow these instructions at home: Safety Move slowly. Avoid sudden body or head movements or  certain positions, as told by your health care provider. Avoid driving or operating machinery until your health care provider says it is safe. Avoid doing any tasks that would be dangerous to you or others if vertigo occurs. If you have trouble walking or keeping your balance, try using a cane for stability. If you feel dizzy or unstable, sit down right away. Return to your normal activities as told by your health care provider. Ask your health care provider what activities are safe for you. General instructions Take over-the-counter and prescription medicines only as told by your health care provider. Drink enough fluid to keep your urine pale yellow. Keep all follow-up visits. This is important. Contact a health care provider if: You have a fever. Your condition gets worse or you develop new symptoms. Your family or friends notice any behavioral changes. You have nausea or vomiting that gets worse. You have numbness or a prickling and tingling sensation. Get help right away if you: Have difficulty speaking or moving. Are always dizzy or faint. Develop severe headaches. Have weakness in your legs or arms. Have changes in your hearing or vision. Develop a stiff neck. Develop sensitivity to light. These symptoms may represent a serious problem that is an emergency. Do not wait to see if the symptoms will go away. Get medical help right away. Call your local emergency services (911 in the [place] Do not drive yourself to the hospital. Summary Vertigo is the feeling that you or your surroundings are moving when they are not. Benign positional vertigo is the most common form of vertigo. This condition is caused by calcium crystals in the inner ear that become displaced. This causes a disturbance in an area of the inner ear that helps your brain sense movement and balance. Symptoms include loss of balance and falling, feeling that you or your surroundings are moving, nausea and vomiting, and  blurred vision. This condition can be diagnosed based on symptoms, a physical exam, and positional tests. Follow safety instructions as told by your health care provider and keep all follow-up visits. This is important. This information is not intended to replace advice given to you by your health care provider. Make sure you discuss any questions you have with your healthcare provider. Document Revised: 01/15/2020 Document Reviewed: 01/15/2020 Elsevier Patient Education  2022 [employer].

## 2020-10-08 NOTE — Progress Notes (Signed)
[facility] [address] [place] [address] [phone]       STROKE FOLLOW UP NOTE  Suzanne Baldwin Date of Birth:  10/09/1955 Medical Record Number:  [id number]   Reason for Referral:  TIA follow up    SUBJECTIVE:   CHIEF COMPLAINT:  Chief Complaint  Patient presents with   Follow-up    RM 3 alone Pt is well, thought she was having another stroke in May, but overall doing well now. She has had maybe one episode since.      HPI:   Update 09/28/2020 Suzanne Baldwin: Returns for 36-monthTIA follow-up.  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  Prescribed meclizine with benefit.  ENT eval - like BPPV.  Had 1 additional event 1 week later but nothing since that time.  Denies new stroke/TIA symptoms.  Compliant on aspirin tolerating without side effects.  Atorvastatin discontinued a couple months ago due to concern of hair loss currently being followed by dermatology.  Prior lipid panel 04/2020 showed LDL 110 (down form 191 10/2019).  Blood pressure today 137/88.  She has since become engaged with scheduled wedding date in September and honeymoon in [place]  No further concerns at this time.   History provided for reference purposes only Update 04/09/2020 Suzanne Baldwin: Ms. WFitchreturns for 473-monthIA follow-up.  She has been stable from stroke standpoint without new or reoccurring stroke/TIA symptoms.  She remains on aspirin 81 mg daily and atorvastatin without side effects.  Blood pressure today 131/83.  Repeat CTA 01/2020 showed continued L VA occlusion.  Cardiac monitor negative for atrial fibrillation.  She has been routinely exercising and modifying diet.  No concerns at this time.  Initial visit 12/11/2019 Suzanne Baldwin: Suzanne Baldwin being seen for hospital follow-up. She has been doing well since discharge but reports she still doesn't feel completely back to normal. Denies continued  vertigo/dizziness, headache, numbness, or nausea.  Denies these symptoms with any type of head movement or position.  She has been doing PT/OT at home and completed PT yesterday. She hasn't returned back to all prior activities yet and is wondering if she is able to return back to driving, aquatic therapy and dry needling treatment for right piriformis muscle and left shoulder pain. denies new or worsening stroke/TIA symptoms.  Continues on aspirin and Plavix without bleeding or bruising.  Continues on atorvastatin without myalgias.  Blood pressure today 127/80.  Completed cardiac monitor 2 days ago but only able to complete 3 weeks due to adhesive irritation.  She has multiple questions today regarding etiology with possible causes, possible future imaging to assess vertebral artery and restrictions.  No further concerns at this time.  Stroke admission 11/16/2019 Suzanne Baldwin a [age].o. female with history of diabetes mellitus, hyperlipidemia, hypertension, sickle cell trait, hx of a clotting disorder, and breast cancer in remission  who presented on 11/16/2019 with intermittent episodes of vertigo for 2 weeks followed by generalized weakness, nausea, increasing fatigue, tachycardia,  palpitations, and a left-sided throbbing headache with neck pain. 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   Suspicion for dissection as there was no other evidence of atherosclerosis.  MRI negative for acute abnormality and likely TIA due to L VA occlusion vs dissection and recommended ruling out occult A. fib or clotting disorder.  Recommended DAPT for 3 months and then aspirin alone.  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  History of palpitations and recommended 30-day cardiac event monitor outpatient to rule out A. fib.  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  HTN stable.  LDL 191 and increase atorvastatin from 10 mg to 80 mg daily.  Controlled DM with A1c 6.6.  Other stroke risk factors include advanced age, obesity and family history of stroke.  No personal history of stroke.  Other active problems include asymmetric enlargement of left palatine tonsil and recommended ENT follow-up outpatient as well as hypokalemia.  Evaluated by therapy and recommended discharge home with [facility] PT and discharged home in stable condition.  TIA due to Left vertebral ? occlusion ? Dissection - need to rule out occult afib or clotting disorder MRI head - No acute intracranial abnormality. Multifocal hyperintense T2-weighted signal within the white matter, most commonly chronic ischemic microangiopathy.  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 The Left V4 segment and PICA are reconstituted intracranially.  2D Echo - EF 60-65%. No source of embolus  LE dopplers no DVT [employer] Virus 2 - negative UDS neg LDL - 191 HgbA1c - 6.6 VTE prophylaxis - SCDs No antithrombotic prior to admission, now on aspirin 81 mg daily and plavix DAPT for 3 months and then ASA alone Patient counseled to be compliant with her antithrombotic medications Ongoing aggressive stroke risk factor management Therapy recommendations:  HH PT Disposition:  home     ROS:   14 system review of systems performed and negative with exception of those listed in HPI  PMH:  Past Medical History:  Diagnosis Date   Allergy    Arthritis    Breast disorder    breast cancer 25 years ago   Cancer ([place])    Clotting disorder ([place])    Diabetes mellitus without complication ([place])    Glaucoma    Hyperlipidemia    Hypertension    Sickle cell trait ([place])     PSH:  Past Surgical History:  Procedure Laterality Date    CESAREAN SECTION     COLONOSCOPY     COSMETIC SURGERY     FRACTURE SURGERY     POLYPECTOMY     ROTATOR CUFF REPAIR     TUBAL LIGATION      Social History:  Social History   Socioeconomic History   Marital status: Single    Spouse name: Not on file   Number of children: Not on file   Years of education: Not on file   Highest education level: Not on file  Occupational History   Not on file  Tobacco Use   Smoking status: Never   Smokeless tobacco: Never  Substance and Sexual Activity   Alcohol use: No   Drug use: No   Sexual activity: Never  Other Topics Concern   Not on file  Social History Narrative   Not on file   Social Determinants of Health   Financial Resource Strain: Not on file  Food Insecurity: Not on file  Transportation Needs: Not on file  Physical Activity: Not on file  Stress: Not on file  Social Connections: Not on file  Intimate Partner Violence: Not on file    Family  History:  Family History  Problem Relation Age of Onset   Diabetes Father    Hypertension Father    Stroke Father    Diabetes Mother    Hypertension Mother    Breast cancer Mother    Cancer Mother    Heart disease Maternal Grandmother    Colon cancer Neg Hx    Esophageal cancer Neg Hx    Stomach cancer Neg Hx    Rectal cancer Neg Hx     Medications:   Current Outpatient Medications on File Prior to Visit  Medication Sig Dispense Refill   aspirin EC 81 MG EC tablet Take 1 tablet (81 mg total) by mouth daily. Swallow whole. 30 tablet 11   brimonidine (ALPHAGAN) 0.2 % ophthalmic solution Place 1 drop into both eyes 3 (three) times daily.     Cholecalciferol (VITAMIN D3 GUMMIES PO) Take 1 tablet by mouth daily.     ELDERBERRY PO Take 3 tablets by mouth daily. gummy     Fluocinolone Acetonide Body 0.01 % OIL Apply 1 application topically every Monday, Wednesday, and Friday. Apply to thin patches on scalp     latanoprost (XALATAN) 0.005 % ophthalmic solution Place 1 drop into  both eyes at bedtime.     levocetirizine (XYZAL) 5 MG tablet Take 5 mg by mouth every evening.     meclizine (ANTIVERT) 12.5 MG tablet Take 1 tablet (12.5 mg total) by mouth 3 (three) times daily as needed for dizziness. 12 tablet 0   OVER THE COUNTER MEDICATION Place 1 drop into both eyes daily as needed (dry eyes). Replenish eye drops     timolol (TIMOPTIC) 0.5 % ophthalmic solution Place 1 drop into both eyes daily.     No current facility-administered medications on file prior to visit.    Allergies:  No Known Allergies    OBJECTIVE:  Physical Exam  Vitals:   10/08/20 0925  BP: 137/88  Pulse: 67  Weight: 213 lb (96.6 kg)  Height: '5\' 8"'$  (1.727 m)    Body mass index is 32.39 kg/m. No results found.   General: well developed, well nourished, very pleasant middle-age African-American female, seated, in no evident distress Head: head normocephalic and atraumatic.   Neck: supple with no carotid or supraclavicular bruits Cardiovascular: regular rate and rhythm, no murmurs Musculoskeletal: no deformity Skin:  no rash/petichiae Vascular:  Normal pulses all extremities   Neurologic Exam Mental Status: Awake and fully alert.   Fluent speech and language.  Oriented to place and time. Recent and remote memory intact. Attention span, concentration and fund of knowledge appropriate. Mood and affect appropriate.  Cranial Nerves: Pupils equal, briskly reactive to light. Extraocular movements full without nystagmus. Visual fields full to confrontation. Hearing intact. Facial sensation intact. Face, tongue, palate moves normally and symmetrically.  Motor: Normal bulk and tone. Normal strength in all tested extremity muscles. Sensory.: intact to touch , pinprick , position and vibratory sensation.  Coordination: Rapid alternating movements normal in all extremities. Finger-to-nose and heel-to-shin performed accurately bilaterally. Gait and Station: Arises from chair without difficulty.  Stance is normal. Gait demonstrates normal stride length and balance without use of assistive device.  Able to tandem walk and heel toe without difficulty.  Romberg negative. Reflexes: 1+ and symmetric. Toes downgoing.          ASSESSMENT: Suzanne Baldwin is a 65 y.o. year old female presented with intermittent episodes of vertigo for 2 weeks followed by generalized weakness, nausea, increasing fatigue, tachycardia, palpitations and a  left-sided throbbing headache with neck pain on 11/16/2019 likely TIA due to left VA occlusion vs dissection. Vascular risk factors include HTN, HLD, DM, sickle cell trait, hx of clotting disorder, hx of DVT, and breast cancer in remission.  Recurrent vertigo 5/25 eval in ED without acute findings felt to be consistent with peripheral vertigo     PLAN:  TIA 2/2 L VA occlusion vs dissection:  Continue aspirin 81 mg daily  and atorvastatin '80mg'$  daily  for secondary stroke prevention.   Cardiac monitor negative for A. Fib Hypercoagulable labs negative MRA head/neck 07/23/2020 chronic left VA occlusion Discussed secondary stroke prevention measures and importance of close PCP follow up for aggressive stroke risk factor management  Peripheral vertigo: likely BPPV. Eval by ENT completed -scheduled to see audiology 8/18.  If recurrence, may consider vestibular therapy HTN: BP goal <130/90.  Well-controlled monitor by PCP. HLD: LDL goal <70.  Prior LDL 110 (04/2020) down from 191. D/c'd atorvastatin approx 2 mo ago due to hair loss concerns. As prior LDL remains above goal, would recommend starting Crestor 40 mg daily due to atorvastatin concern DMII: A1c goal<7.0.  Prior A1c 6.3.  Diet controlled routinely monitored by PCP    Follow up in 6 months or call earlier if needed   CC:  [place] provider: Dr. Barron Alvine, Dola Factor, MD    I spent 36 minutes of face-to-face and non-face-to-face time with patient.  This included previsit chart review, lab review,  study review, order entry, electronic health record documentation, patient education regarding episode of vertigo including etiologies and further education, hx of TIA and chronic L VA occlusion, secondary stroke prevention measures and importance of managing stroke risk factors and answered all other questions to patient satisfaction  Frann Rider, AGNP-BC  [facility] [address] [place], [place] [address]  Phone [phone] Fax [phone] Note: This document was prepared with digital dictation and possible smart phrase technology. Any transcriptional errors that result from this process are unintentional.

## 2020-10-12 NOTE — Progress Notes (Signed)
I agree with the above plan 

## 2021-02-10 ENCOUNTER — Other Ambulatory Visit: Payer: Self-pay | Admitting: Student

## 2021-04-19 ENCOUNTER — Encounter: Payer: Self-pay | Admitting: Adult Health

## 2021-04-19 ENCOUNTER — Ambulatory Visit: Payer: Medicare Other | Admitting: Adult Health

## 2021-04-19 VITALS — BP 157/97 | HR 80 | Ht 68.0 in | Wt 211.0 lb

## 2021-04-19 DIAGNOSIS — I6502 Occlusion and stenosis of left vertebral artery: Secondary | ICD-10-CM | POA: Diagnosis not present

## 2021-04-19 NOTE — Progress Notes (Signed)
[facility] [address] [place] [address] [phone]       STROKE FOLLOW UP NOTE  Ms. Jenai Scaletta Date of Birth:  06-Apr-1955 Medical Record Number:  [id number]   Reason for Referral:  TIA follow up    SUBJECTIVE:   CHIEF COMPLAINT:  Chief Complaint  Patient presents with   Follow-up    Rm 3 alone here for 6 month f/u. Reports she has been doing well. Planning to move from [place] to [facility] in April.      HPI:   Update 04/19/2021 JM: 66 year old female returns for TIA follow-up.  Overall stable without new or reoccurring stroke/TIA symptoms.  On aspirin and Crestor without side effects but does admit forgetting dosages.  Recent lipid panel LDL 133.  Blood pressure today 157/97 - routinely monitors at home and typically stable. Is currently in the process of getting her house to ready to sell with plans on moving to [place] in April which she believes is causing her higher blood pressure.  Routinely followed by PCP -recent visit 3 weeks ago. Does endorse good diet and routine physical activity and exercise.  No further concerns at this time.    History provided for reference purposes only Update 10/08/2020 JM: Returns for 15-month TIA follow-up.  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  Prescribed meclizine with benefit.  ENT eval - like BPPV.  Had 1 additional event 1 week later but nothing since that time.  Denies new stroke/TIA symptoms.  Compliant on aspirin tolerating without side effects.  Atorvastatin discontinued a couple months ago due to concern of hair loss currently being followed by dermatology.  Prior lipid panel 04/2020 showed LDL 110 (down form 191 10/2019).  Blood pressure today 137/88.  She has since become engaged with scheduled wedding date in September and honeymoon in [place].  No further concerns at this time.  Update 04/09/2020 JM: Ms. Herne returns  for 30-month TIA follow-up.  She has been stable from stroke standpoint without new or reoccurring stroke/TIA symptoms.  She remains on aspirin 81 mg daily and atorvastatin without side effects.  Blood pressure today 131/83.  Repeat CTA 01/2020 showed continued L VA occlusion.  Cardiac monitor negative for atrial fibrillation.  She has been routinely exercising and modifying diet.  No concerns at this time.  Initial visit 12/11/2019 JM: Ms. Torrens is being seen for hospital follow-up. She has been doing well since discharge but reports she still doesn't feel completely back to normal. Denies continued vertigo/dizziness, headache, numbness, or nausea.  Denies these symptoms with any type of head movement or position.  She has been doing PT/OT at home and completed PT yesterday. She hasn't returned back to all prior activities yet and is wondering if she is able to return back to driving, aquatic therapy and dry needling treatment for right piriformis muscle and left shoulder pain. denies new or worsening stroke/TIA symptoms.  Continues on aspirin and Plavix without bleeding or bruising.  Continues on atorvastatin without myalgias.  Blood pressure today 127/80.  Completed cardiac monitor 2 days ago but only able to complete 3 weeks due to adhesive irritation.  She has multiple questions today regarding etiology with possible causes, possible future imaging to assess vertebral artery and restrictions.  No further concerns at this time.  Stroke admission 11/16/2019 Ms. Ronetta Molla is a 66 y.o. female with history of diabetes mellitus, hyperlipidemia, hypertension, sickle  cell trait, hx of a clotting disorder, and breast cancer in remission  who presented on 11/16/2019 with intermittent episodes of vertigo for 2 weeks followed by generalized weakness, nausea, increasing fatigue, tachycardia,  palpitations, and a left-sided throbbing headache with neck pain. 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   Suspicion for dissection as there was no other evidence of atherosclerosis.  MRI negative for acute abnormality and likely TIA due to L VA occlusion vs dissection and recommended ruling out occult A. fib or clotting disorder.  Recommended DAPT for 3 months and then aspirin alone.  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  History of palpitations and recommended 30-day cardiac event monitor outpatient to rule out A. fib.  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  HTN stable.  LDL 191 and increase atorvastatin from 10 mg to 80 mg daily.  Controlled DM with A1c 6.6.  Other stroke risk factors include advanced age, obesity and family history of stroke.  No personal history of stroke.  Other active problems include asymmetric enlargement of left palatine tonsil and recommended ENT follow-up outpatient as well as hypokalemia.  Evaluated by therapy and recommended discharge home with [facility] PT and discharged home in stable condition.  TIA due to Left vertebral ? occlusion ? Dissection - need to rule out occult afib or clotting disorder MRI head - No acute intracranial abnormality. Multifocal hyperintense T2-weighted signal within the white matter, most commonly chronic ischemic microangiopathy.  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 The Left V4 segment and PICA are reconstituted intracranially.  2D Echo - EF 60-65%. No source of embolus  LE dopplers no DVT [employer] Virus 2 - negative UDS neg LDL - 191 HgbA1c - 6.6 VTE prophylaxis - SCDs No antithrombotic prior to admission, now on aspirin 81 mg daily and plavix DAPT for 3 months and then ASA alone Patient counseled to be  compliant with her antithrombotic medications Ongoing aggressive stroke risk factor management Therapy recommendations:  HH PT Disposition:  home     ROS:   14 system review of systems performed and negative with exception of those listed in HPI  PMH:  Past Medical History:  Diagnosis Date   Allergy    Arthritis    Breast disorder    breast cancer 25 years ago   Cancer ([place])    Clotting disorder ([place])    Diabetes mellitus without complication ([place])    Glaucoma    Hyperlipidemia    Hypertension    Sickle cell trait ([place])     PSH:  Past Surgical History:  Procedure Laterality Date   CESAREAN SECTION     COLONOSCOPY     COSMETIC SURGERY     FRACTURE SURGERY     POLYPECTOMY     ROTATOR CUFF REPAIR     TUBAL LIGATION      Social History:  Social History   Socioeconomic History   Marital status: Single    Spouse name: Not on file   Number of children: Not on file   Years of education: Not on file   Highest education level: Not on file  Occupational History   Not on  file  Tobacco Use   Smoking status: Never   Smokeless tobacco: Never  Substance and Sexual Activity   Alcohol use: No   Drug use: No   Sexual activity: Never  Other Topics Concern   Not on file  Social History Narrative   Not on file   Social Determinants of Health   Financial Resource Strain: Not on file  Food Insecurity: Not on file  Transportation Needs: Not on file  Physical Activity: Not on file  Stress: Not on file  Social Connections: Not on file  Intimate Partner Violence: Not on file    Family History:  Family History  Problem Relation Age of Onset   Diabetes Father    Hypertension Father    Stroke Father    Diabetes Mother    Hypertension Mother    Breast cancer Mother    Cancer Mother    Heart disease Maternal Grandmother    Colon cancer Neg Hx    Esophageal cancer Neg Hx    Stomach cancer Neg Hx    Rectal cancer Neg Hx     Medications:   Current Outpatient  Medications on File Prior to Visit  Medication Sig Dispense Refill   amLODipine (NORVASC) 2.5 MG tablet Take 2.5 mg by mouth daily.     aspirin EC 81 MG EC tablet Take 1 tablet (81 mg total) by mouth daily. Swallow whole. 30 tablet 11   brimonidine (ALPHAGAN) 0.2 % ophthalmic solution Place 1 drop into both eyes 3 (three) times daily.     Cholecalciferol (VITAMIN D3 GUMMIES PO) Take 1 tablet by mouth daily.     ELDERBERRY PO Take 3 tablets by mouth daily. gummy     latanoprost (XALATAN) 0.005 % ophthalmic solution Place 1 drop into both eyes at bedtime.     levocetirizine (XYZAL) 5 MG tablet Take 5 mg by mouth every evening.     meclizine (ANTIVERT) 12.5 MG tablet Take 1 tablet (12.5 mg total) by mouth 3 (three) times daily as needed for dizziness. 12 tablet 0   OVER THE COUNTER MEDICATION Place 1 drop into both eyes daily as needed (dry eyes). Replenish eye drops     rosuvastatin (CRESTOR) 40 MG tablet Take 1 tablet (40 mg total) by mouth daily. 30 tablet 5   timolol (TIMOPTIC) 0.5 % ophthalmic solution Place 1 drop into both eyes daily.     No current facility-administered medications on file prior to visit.    Allergies:  No Known Allergies    OBJECTIVE:  Physical Exam  Vitals:   04/19/21 0934  BP: (!) 157/97  Pulse: 80  Weight: 211 lb (95.7 kg)  Height: 5\' 8"  (1.727 m)   Body mass index is 32.08 kg/m. No results found.   General: well developed, well nourished, very pleasant middle-age African-American female, seated, in no evident distress Head: head normocephalic and atraumatic.   Neck: supple with no carotid or supraclavicular bruits Cardiovascular: regular rate and rhythm, no murmurs Musculoskeletal: no deformity Skin:  no rash/petichiae Vascular:  Normal pulses all extremities   Neurologic Exam Mental Status: Awake and fully alert.   Fluent speech and language.  Oriented to place and time. Recent and remote memory intact. Attention span, concentration and fund  of knowledge appropriate. Mood and affect appropriate.  Cranial Nerves: Pupils equal, briskly reactive to light. Extraocular movements full without nystagmus. Visual fields full to confrontation. Hearing intact. Facial sensation intact. Face, tongue, palate moves normally and symmetrically.  Motor: Normal bulk and  tone. Normal strength in all tested extremity muscles. Sensory.: intact to touch , pinprick , position and vibratory sensation.  Coordination: Rapid alternating movements normal in all extremities. Finger-to-nose and heel-to-shin performed accurately bilaterally. Gait and Station: Arises from chair without difficulty. Stance is normal. Gait demonstrates normal stride length and balance without use of assistive device.  Able to tandem walk and heel toe without difficulty.   Reflexes: 1+ and symmetric. Toes downgoing.          ASSESSMENT: Suzanne Baldwin is a 66 y.o. year old female with TIA on 11/16/2019 likely due to left VA occlusion vs dissection. Vascular risk factors include HTN, HLD, DM, sickle cell trait, hx of clotting disorder, hx of DVT, and breast cancer in remission.  Recurrent vertigo 07/22/2020 eval in ED without acute findings felt to be consistent with peripheral vertigo without recurrence since that time.      PLAN:  TIA 2/2 L VA occlusion vs dissection:  Continue aspirin 81 mg daily  and Crestor 40 mg daily for secondary stroke prevention.  Discussed importance of ensuring medication compliance and taking on a daily basis as prescribed Cardiac monitor negative for A. Fib Hypercoagulable labs negative MRA head/neck 07/23/2020 chronic left VA occlusion Discussed secondary stroke prevention measures and importance of close PCP follow up for aggressive stroke risk factor management  HTN: BP goal <130/90.  Well-controlled monitor by PCP. HLD: LDL goal <70.  LDL 133 on Crestor 40 mg but does admit to not taking consistently -discussed importance of taking on a daily  basis which will likely help lower LDL. Reported hair loss on atorvastatin.  DMII: A1c goal<7.0.  Prior A1c 6.3.  Diet controlled routinely monitored by PCP    Doing well from stroke standpoint and risk factors are managed by PCP. She may follow up PRN, as usual for our patients who are strictly being followed for stroke. If any new neurological issues should arise, request PCP place referral for evaluation by one of our neurologists. Thank you.     CC:  Bartholome Bill, MD    I spent 31 minutes of face-to-face and non-face-to-face time with patient.  This included previsit chart review, lab review, study review, order entry, electronic health record documentation, patient education regarding hx of TIA and chronic L VA occlusion, secondary stroke prevention measures and importance of managing stroke risk factors and answered all other questions to patient satisfaction  Frann Rider, AGNP-BC  [facility] [address] [place], [place] [address]  Phone [phone] Fax [phone] Note: This document was prepared with digital dictation and possible smart phrase technology. Any transcriptional errors that result from this process are unintentional.

## 2021-04-19 NOTE — Patient Instructions (Signed)
Continue aspirin 81 mg daily  and Crestor 40mg  daily  for secondary stroke prevention. Recommend adding Zetia to regimen and ensure recheck of cholesterol levels in the next 2-3 months. If remains elevated, you may need to consider a twice monthly injectable medication (Repatha or Praluent) for further management  Continue to follow up with PCP regarding cholesterol and blood pressure management  Maintain strict control of hypertension with blood pressure goal below 130/90 and cholesterol with LDL cholesterol (bad cholesterol) goal below 70 mg/dL.   Signs of a Stroke? Follow the BEFAST method:  Balance Watch for a sudden loss of balance, trouble with coordination or vertigo Eyes Is there a sudden loss of vision in one or both eyes? Or double vision?  Face: Ask the person to smile. Does one side of the face droop or is it numb?  Arms: Ask the person to raise both arms. Does one arm drift downward? Is there weakness or numbness of a leg? Speech: Ask the person to repeat a simple phrase. Does the speech sound slurred/strange? Is the person confused ? Time: If you observe any of these signs, call 911.       Thank you for coming to see [place] at [facility]. I hope we have been able to provide you high quality care today.  You may receive a patient satisfaction survey over the next few weeks. We would appreciate your feedback and comments so that we may continue to improve ourselves and the health of our patients.
